# Patient Record
Sex: Female | Born: 1950
Health system: Southern US, Community
[De-identification: ages and names within clinical notes are randomized; demographics above are authoritative.]

## PROBLEM LIST (undated history)

## (undated) DIAGNOSIS — F32A Depression, unspecified: Secondary | ICD-10-CM

## (undated) DIAGNOSIS — F329 Major depressive disorder, single episode, unspecified: Secondary | ICD-10-CM

## (undated) HISTORY — PX: DILATION AND CURETTAGE OF UTERUS: SHX78

---

## 2011-05-11 ENCOUNTER — Other Ambulatory Visit (HOSPITAL_COMMUNITY)
Admission: RE | Admit: 2011-05-11 | Discharge: 2011-05-11 | Disposition: A | Discharge status: Home / Self Care | Payer: BC Managed Care – PPO | Source: Ambulatory Visit | Attending: Family Medicine | Admitting: Family Medicine

## 2011-05-11 DIAGNOSIS — Z1159 Encounter for screening for other viral diseases: Secondary | ICD-10-CM | POA: Insufficient documentation

## 2011-05-11 DIAGNOSIS — Z124 Encounter for screening for malignant neoplasm of cervix: Secondary | ICD-10-CM | POA: Insufficient documentation

## 2011-05-12 ENCOUNTER — Other Ambulatory Visit: Payer: Self-pay | Admitting: Family Medicine

## 2011-05-12 DIAGNOSIS — Z1231 Encounter for screening mammogram for malignant neoplasm of breast: Secondary | ICD-10-CM

## 2011-05-27 ENCOUNTER — Ambulatory Visit
Admission: RE | Admit: 2011-05-27 | Discharge: 2011-05-27 | Disposition: A | Discharge status: Home / Self Care | Payer: BC Managed Care – PPO | Source: Ambulatory Visit | Attending: Family Medicine | Admitting: Family Medicine

## 2011-05-27 DIAGNOSIS — Z1231 Encounter for screening mammogram for malignant neoplasm of breast: Secondary | ICD-10-CM

## 2012-01-12 ENCOUNTER — Encounter (HOSPITAL_COMMUNITY): Payer: Self-pay | Admitting: *Deleted

## 2012-01-12 ENCOUNTER — Ambulatory Visit (HOSPITAL_COMMUNITY)
Admission: RE | Admit: 2012-01-12 | Discharge: 2012-01-12 | Disposition: A | Discharge status: Home / Self Care | Payer: BC Managed Care – PPO | Source: Ambulatory Visit | Attending: Gastroenterology | Admitting: Gastroenterology

## 2012-01-12 NOTE — Anesthesia Preprocedure Evaluation (Addendum)
Anesthesia Evaluation  Patient identified by MRN, date of birth, ID band Patient awake    Reviewed: Allergy & Precautions, H&P , NPO status , Patient's Chart, lab work & pertinent test results  Airway Mallampati: I TM Distance: >3 FB Neck ROM: Full    Dental No notable dental hx. (+) Teeth Intact and Dental Advisory Given   Pulmonary neg pulmonary ROS,  breath sounds clear to auscultation  Pulmonary exam normal       Cardiovascular Exercise Tolerance: Good negative cardio ROS  Rhythm:Regular Rate:Normal     Neuro/Psych PSYCHIATRIC DISORDERS Depression negative neurological ROS  negative psych ROS   GI/Hepatic negative GI ROS, Neg liver ROS,   Endo/Other  negative endocrine ROS  Renal/GU negative Renal ROS  negative genitourinary   Musculoskeletal negative musculoskeletal ROS (+)   Abdominal   Peds negative pediatric ROS (+)  Hematology negative hematology ROS (+)   Anesthesia Other Findings   Reproductive/Obstetrics negative OB ROS                          Anesthesia Physical Anesthesia Plan  ASA: I  Anesthesia Plan: MAC   Post-op Pain Management:    Induction: Intravenous  Airway Management Planned:   Additional Equipment:   Intra-op Plan:   Post-operative Plan:   Informed Consent: I have reviewed the patients History and Physical, chart, labs and discussed the procedure including the risks, benefits and alternatives for the proposed anesthesia with the patient or authorized representative who has indicated his/her understanding and acceptance.   Dental advisory given  Plan Discussed with: CRNA  Anesthesia Plan Comments:         Anesthesia Quick Evaluation

## 2012-01-13 ENCOUNTER — Encounter (HOSPITAL_COMMUNITY)
Admission: RE | Disposition: A | Discharge status: Home / Self Care | Payer: Self-pay | Source: Ambulatory Visit | Attending: Gastroenterology

## 2012-01-13 ENCOUNTER — Ambulatory Visit (HOSPITAL_COMMUNITY): Payer: BC Managed Care – PPO | Admitting: Anesthesiology

## 2012-01-13 ENCOUNTER — Ambulatory Visit (HOSPITAL_COMMUNITY)
Admission: RE | Admit: 2012-01-13 | Discharge: 2012-01-13 | Disposition: A | Discharge status: Home / Self Care | Payer: BC Managed Care – PPO | Source: Ambulatory Visit | Attending: Gastroenterology | Admitting: Gastroenterology

## 2012-01-13 ENCOUNTER — Encounter (HOSPITAL_COMMUNITY): Payer: Self-pay | Admitting: Anesthesiology

## 2012-01-13 ENCOUNTER — Encounter (HOSPITAL_COMMUNITY): Payer: Self-pay | Admitting: *Deleted

## 2012-01-13 DIAGNOSIS — D126 Benign neoplasm of colon, unspecified: Secondary | ICD-10-CM | POA: Insufficient documentation

## 2012-01-13 DIAGNOSIS — K5730 Diverticulosis of large intestine without perforation or abscess without bleeding: Secondary | ICD-10-CM | POA: Insufficient documentation

## 2012-01-13 HISTORY — DX: Major depressive disorder, single episode, unspecified: F32.9

## 2012-01-13 HISTORY — DX: Depression, unspecified: F32.A

## 2012-01-13 HISTORY — PX: HOT HEMOSTASIS: SHX5433

## 2012-01-13 SURGERY — COLONOSCOPY WITH PROPOFOL
Anesthesia: Monitor Anesthesia Care

## 2012-01-13 MED ORDER — DROPERIDOL 2.5 MG/ML IJ SOLN
INTRAMUSCULAR | Status: DC | PRN
  Administered 2012-01-13: .5 mg via INTRAVENOUS

## 2012-01-13 MED ORDER — KETAMINE HCL 10 MG/ML IJ SOLN
INTRAMUSCULAR | Status: DC | PRN
  Administered 2012-01-13: 5 mg via INTRAVENOUS

## 2012-01-13 MED ORDER — PROPOFOL 10 MG/ML IV EMUL
INTRAVENOUS | Status: DC | PRN
  Administered 2012-01-13: 75 ug/kg/min via INTRAVENOUS

## 2012-01-13 MED ORDER — FENTANYL CITRATE 0.05 MG/ML IJ SOLN
INTRAMUSCULAR | Status: DC | PRN
  Administered 2012-01-13: 50 ug via INTRAVENOUS

## 2012-01-13 MED ORDER — LACTATED RINGERS IV SOLN: INTRAVENOUS | Status: DC

## 2012-01-13 MED ORDER — LACTATED RINGERS IV SOLN
INTRAVENOUS | Status: DC | PRN
  Administered 2012-01-13: 08:00:00 via INTRAVENOUS

## 2012-01-13 MED ORDER — MIDAZOLAM HCL 5 MG/5ML IJ SOLN
INTRAMUSCULAR | Status: DC | PRN
  Administered 2012-01-13 (×2): 1 mg via INTRAVENOUS

## 2012-01-13 MED ORDER — LACTATED RINGERS IV SOLN
INTRAVENOUS | Status: DC
  Administered 2012-01-13: 1000 mL via INTRAVENOUS

## 2012-01-13 MED ORDER — ONDANSETRON HCL 4 MG/2ML IJ SOLN
INTRAMUSCULAR | Status: DC | PRN
  Administered 2012-01-13: 4 mg via INTRAVENOUS

## 2012-01-13 MED ORDER — FENTANYL CITRATE 0.05 MG/ML IJ SOLN: 25.0000 ug | INTRAMUSCULAR | Status: DC | PRN

## 2012-01-13 MED ORDER — ASPIRIN 81 MG PO TABS: 81.0000 mg | ORAL_TABLET | Freq: Every day | ORAL | Status: DC

## 2012-01-13 SURGICAL SUPPLY — 21 items

## 2012-01-13 NOTE — Addendum Note (Signed)
Addendum  created 01/13/12 1104 by Ashauna Bertholf S Shayona Hibbitts, CRNA   Modules edited:Anesthesia Medication Administration    

## 2012-01-13 NOTE — Anesthesia Postprocedure Evaluation (Signed)
  Anesthesia Post-op Note  Patient: Brittany Villegas  Procedure(s) Performed: Procedure(s) (LRB): COLONOSCOPY WITH PROPOFOL (N/A) HOT HEMOSTASIS (ARGON PLASMA COAGULATION/BICAP) (N/A)  Patient Location: PACU  Anesthesia Type: MAC  Level of Consciousness: awake and alert   Airway and Oxygen Therapy: Patient Spontanous Breathing  Post-op Pain: mild  Post-op Assessment: Post-op Vital signs reviewed, Patient's Cardiovascular Status Stable, Respiratory Function Stable, Patent Airway and No signs of Nausea or vomiting  Post-op Vital Signs: stable  Complications: No apparent anesthesia complications

## 2012-01-13 NOTE — Discharge Instructions (Signed)
Colonoscopy ° °Post procedure instructions: ° °Read the instructions outlined below and refer to this sheet in the next few weeks. These discharge instructions provide you with general information on caring for yourself after you leave the hospital. Your doctor may also give you specific instructions. While your treatment has been planned according to the most current medical practices available, unavoidable complications occasionally occur. If you have any problems or questions after discharge, call Dr. Shardee Dieu at [facility] ([phone]). ° °HOME CARE INSTRUCTIONS ° °ACTIVITY: °· You may resume your regular activity, but move at a slower pace for the next 24 hours.  °· Take frequent rest periods for the next 24 hours.  °· Walking will help get rid of the air and reduce the bloated feeling in your belly (abdomen).  °· No driving for 24 hours (because of the medicine (anesthesia) used during the test).  °· You may shower.  °· Do not sign any important legal documents or operate any machinery for 24 hours (because of the anesthesia used during the test).  °NUTRITION: °· Drink plenty of fluids.  °· You may resume your normal diet as instructed by your doctor.  °· Begin with a light meal and progress to your normal diet. Heavy or fried foods are harder to digest and may make you feel sick to your stomach (nauseated).  °· Avoid alcoholic beverages for 24 hours or as instructed.  °MEDICATIONS: °· You may resume your normal medications unless your doctor tells you otherwise.  °WHAT TO EXPECT TODAY: °· Some feelings of bloating in the abdomen.  °· Passage of more gas than usual.  °· Spotting of blood in your stool or on the toilet paper.  °IF YOU HAD POLYPS REMOVED DURING THE COLONOSCOPY: °· No aspirin products for 7 days or as instructed.  °· No alcohol for 7 days or as instructed.  °· Eat a soft diet for the next 24 hours.  ° °FINDING OUT THE RESULTS OF YOUR TEST ° °Not all test results are available during your  visit. If your test results are not back during the visit, make an appointment with your caregiver to find out the results. Do not assume everything is normal if you have not heard from your caregiver or the medical facility. It is important for you to follow up on all of your test results.  ° ° ° °SEEK IMMEDIATE MEDICAL CARE IF: ° °· You have more than a spotting of blood in your stool.  °· Your belly is swollen (abdominal distention).  °· You are nauseated or vomiting.  °· You have a fever.  °· You have abdominal pain or discomfort that is severe or gets worse throughout the day.  ° ° °Document Released: 04/21/2004 Document Revised: 05/20/2011 Document Reviewed: 04/19/2008 °ExitCare® Patient Information ©2012 [employer]. ° °

## 2012-01-13 NOTE — Addendum Note (Signed)
Addendum  created 01/13/12 1104 by Valeda Malm, CRNA   Modules edited:Anesthesia Medication Administration

## 2012-01-13 NOTE — Transfer of Care (Signed)
Immediate Anesthesia Transfer of Care Note  Patient: Brittany Villegas  Procedure(s) Performed: Procedure(s) (LRB): COLONOSCOPY WITH PROPOFOL (N/A) HOT HEMOSTASIS (ARGON PLASMA COAGULATION/BICAP) (N/A)  Patient Location: PACU  Anesthesia Type: MAC  Level of Consciousness: awake  Airway & Oxygen Therapy: Patient Spontanous Breathing and Patient connected to face mask  Post-op Assessment: Report given to PACU RN  Post vital signs: Reviewed and stable  Complications: No apparent anesthesia complications

## 2012-01-13 NOTE — Op Note (Signed)
[facility] [address] [place], [place]  [address]  COLONOSCOPY PROCEDURE REPORT  PATIENT:  Brittany Villegas, Brittany Villegas  MR#:  [id number] BIRTHDATE:  03/09/1951, 60 yrs. old  GENDER:  female ENDOSCOPIST:  Willis Modena, MD REF. BY:  Ancil Boozer, M.D.  PROCEDURE DATE:  01/13/2012 PROCEDURE:  Colonoscopy with polypectomy and submucosal injection ASA CLASS:  Class I INDICATIONS:  large tubulovillous adenoma of proximal colon MEDICATIONS:   MAC sedation, administered by CRNA  DESCRIPTION OF PROCEDURE:   After the risks benefits and alternatives of the procedure were thoroughly explained, informed consent was obtained.  Digital rectal exam was performed and revealed no abnormalities.   The EC-[id number] ([id number]) endoscope was introduced through the anus and advanced to the cecum, which was identified by both the appendix and ileocecal valve, without limitations.  The quality of the prep was good..  The instrument was then slowly withdrawn as the colon was fully examined.  <<PROCEDUREIMAGES>>  FINDINGS:  Digital rectal exam normal.  Prep quality was good. Few left-sided diverticula.  At fold directly opposite to, but not involving, the ileocecal valve, a sessile 2cm polyp was seen with prior tattoo marking noted.  The polyp  was lifted via submucosal injection of saline (via the ERBE needleless device); endoscopic effect was good.  The polyp was then removed via snare cautery with good effect.  No other polyps, masses, vascular ectasias, or inflammatory changes were seen.  Retroflexed view of rectum was normal.  COMPLICATIONS:  None  ENDOSCOPIC IMPRESSION:    1.  Large proximal colon tubulovillous adenoma, removed as above. 2.  Sigmoid diverticulosis. 3.  Otherwise normal colonoscopy.  RECOMMENDATIONS:      1.  Watch for potential complications of procedure. 2.  No ASA/NSAIDs x 7 days post-poypectomy. 3.  Await polypectomy results. 4.  Will likely need repeat colonoscopy in  2-3 years, pending polypectomy results.  ______________________________ Willis Modena  CC:  n. eSIGNEDWillis Modena at 01/13/2012 09:22 AM  Kevin Fenton, [phone]

## 2012-01-13 NOTE — H&P (Signed)
Cc:  Large proximal colon polyp  HPI:  61 yo female who had screening colonoscopy October 2012, which showed 20 mm proximal colon polyp at junction of cecum and ileocecal valve.  Biopsies showed tubulovillous adenoma.  Site tattooed.  She is asymptomatic.  MEDS:  MVI, ASA, Zoloft  PMH/PSHx:  Anxiety, HTN, D&C 2007/2008  FHx:  No colon cancer or polyps  SocHx:  Paralegal  ROS:  As her HPI; all others negative  PE: GEN:  NAD ABD:  Soft NEURO:  Non-focal  Labs:  None  Assessment:  Large proximal colon polyp (tubulovillous adenoma)  Plan:   1.  Colonoscopy with polypectomy, possibly requiring submucosal lift of polyp, possible APC. 2.  Patient is aware of relative increased risk of post-polypectomy perforation (5%) and post-polypectomy bleeding (10%).  I have discussed the alternative of surgical removal, and she would prefer attempted endoscopic approach with surgery only if there are endoscopic complications or if endoscopic removal is not complete/definitive. 3.  Risks (bleeding, infection, bowel perforation that could require surgery, sedation-related changes in cardiopulmonary systems), benefits (identification and possible treatment of source of symptoms, exclusion of certain causes of symptoms), and alternatives (watchful waiting, radiographic imaging studies, empiric medical treatment) of colonoscopy were explained to patient in detail and she wishes to proceed.

## 2012-01-14 ENCOUNTER — Encounter (HOSPITAL_COMMUNITY): Payer: Self-pay | Admitting: Gastroenterology

## 2012-05-18 ENCOUNTER — Other Ambulatory Visit: Payer: Self-pay | Admitting: Family Medicine

## 2012-05-18 DIAGNOSIS — Z1231 Encounter for screening mammogram for malignant neoplasm of breast: Secondary | ICD-10-CM

## 2012-06-07 ENCOUNTER — Ambulatory Visit: Payer: Self-pay

## 2012-06-09 ENCOUNTER — Ambulatory Visit
Admission: RE | Admit: 2012-06-09 | Discharge: 2012-06-09 | Disposition: A | Discharge status: Home / Self Care | Payer: BC Managed Care – PPO | Source: Ambulatory Visit | Attending: Family Medicine | Admitting: Family Medicine

## 2012-06-09 DIAGNOSIS — Z1231 Encounter for screening mammogram for malignant neoplasm of breast: Secondary | ICD-10-CM

## 2012-08-30 ENCOUNTER — Other Ambulatory Visit: Payer: Self-pay

## 2013-08-24 ENCOUNTER — Other Ambulatory Visit: Payer: Self-pay

## 2013-08-24 DIAGNOSIS — Z1231 Encounter for screening mammogram for malignant neoplasm of breast: Secondary | ICD-10-CM

## 2013-09-27 ENCOUNTER — Ambulatory Visit
Admission: RE | Admit: 2013-09-27 | Discharge: 2013-09-27 | Disposition: A | Discharge status: Home / Self Care | Payer: BC Managed Care – PPO | Source: Ambulatory Visit

## 2013-09-27 DIAGNOSIS — Z1231 Encounter for screening mammogram for malignant neoplasm of breast: Secondary | ICD-10-CM

## 2014-11-02 ENCOUNTER — Other Ambulatory Visit: Payer: Self-pay

## 2014-11-02 DIAGNOSIS — Z1231 Encounter for screening mammogram for malignant neoplasm of breast: Secondary | ICD-10-CM

## 2014-11-09 ENCOUNTER — Encounter (INDEPENDENT_AMBULATORY_CARE_PROVIDER_SITE_OTHER): Payer: Self-pay

## 2014-11-09 ENCOUNTER — Ambulatory Visit
Admission: RE | Admit: 2014-11-09 | Discharge: 2014-11-09 | Disposition: A | Discharge status: Home / Self Care | Payer: BLUE CROSS/BLUE SHIELD | Source: Ambulatory Visit

## 2014-11-09 DIAGNOSIS — Z1231 Encounter for screening mammogram for malignant neoplasm of breast: Secondary | ICD-10-CM

## 2016-01-02 ENCOUNTER — Other Ambulatory Visit: Payer: Self-pay

## 2016-01-02 DIAGNOSIS — Z1231 Encounter for screening mammogram for malignant neoplasm of breast: Secondary | ICD-10-CM

## 2016-01-16 ENCOUNTER — Ambulatory Visit
Admission: RE | Admit: 2016-01-16 | Discharge: 2016-01-16 | Disposition: A | Discharge status: Home / Self Care | Payer: BLUE CROSS/BLUE SHIELD | Source: Ambulatory Visit

## 2016-01-16 DIAGNOSIS — Z1231 Encounter for screening mammogram for malignant neoplasm of breast: Secondary | ICD-10-CM

## 2017-04-08 DIAGNOSIS — H35371 Puckering of macula, right eye: Secondary | ICD-10-CM | POA: Diagnosis not present

## 2017-04-08 DIAGNOSIS — H35373 Puckering of macula, bilateral: Secondary | ICD-10-CM | POA: Diagnosis not present

## 2017-04-16 DIAGNOSIS — H35373 Puckering of macula, bilateral: Secondary | ICD-10-CM | POA: Diagnosis not present

## 2017-04-16 DIAGNOSIS — H35343 Macular cyst, hole, or pseudohole, bilateral: Secondary | ICD-10-CM | POA: Diagnosis not present

## 2017-05-07 DIAGNOSIS — H35371 Puckering of macula, right eye: Secondary | ICD-10-CM | POA: Diagnosis not present

## 2017-05-07 DIAGNOSIS — H35341 Macular cyst, hole, or pseudohole, right eye: Secondary | ICD-10-CM | POA: Diagnosis not present

## 2017-07-28 DIAGNOSIS — H35372 Puckering of macula, left eye: Secondary | ICD-10-CM | POA: Diagnosis not present

## 2017-07-28 DIAGNOSIS — H25013 Cortical age-related cataract, bilateral: Secondary | ICD-10-CM | POA: Diagnosis not present

## 2017-07-28 DIAGNOSIS — H43812 Vitreous degeneration, left eye: Secondary | ICD-10-CM | POA: Diagnosis not present

## 2017-07-28 DIAGNOSIS — H2513 Age-related nuclear cataract, bilateral: Secondary | ICD-10-CM | POA: Diagnosis not present

## 2017-10-12 DIAGNOSIS — H2513 Age-related nuclear cataract, bilateral: Secondary | ICD-10-CM | POA: Diagnosis not present

## 2017-10-12 DIAGNOSIS — H02839 Dermatochalasis of unspecified eye, unspecified eyelid: Secondary | ICD-10-CM | POA: Diagnosis not present

## 2017-10-12 DIAGNOSIS — H25013 Cortical age-related cataract, bilateral: Secondary | ICD-10-CM | POA: Diagnosis not present

## 2017-10-12 DIAGNOSIS — H2511 Age-related nuclear cataract, right eye: Secondary | ICD-10-CM | POA: Diagnosis not present

## 2017-10-12 DIAGNOSIS — H35372 Puckering of macula, left eye: Secondary | ICD-10-CM | POA: Diagnosis not present

## 2017-10-12 DIAGNOSIS — H25043 Posterior subcapsular polar age-related cataract, bilateral: Secondary | ICD-10-CM | POA: Diagnosis not present

## 2017-11-01 DIAGNOSIS — H25011 Cortical age-related cataract, right eye: Secondary | ICD-10-CM | POA: Diagnosis not present

## 2017-11-01 DIAGNOSIS — H2511 Age-related nuclear cataract, right eye: Secondary | ICD-10-CM | POA: Diagnosis not present

## 2017-11-02 DIAGNOSIS — H2512 Age-related nuclear cataract, left eye: Secondary | ICD-10-CM | POA: Diagnosis not present

## 2017-11-15 DIAGNOSIS — H25012 Cortical age-related cataract, left eye: Secondary | ICD-10-CM | POA: Diagnosis not present

## 2017-11-15 DIAGNOSIS — H2512 Age-related nuclear cataract, left eye: Secondary | ICD-10-CM | POA: Diagnosis not present

## 2018-01-17 ENCOUNTER — Other Ambulatory Visit: Payer: Self-pay | Admitting: Family Medicine

## 2018-01-17 DIAGNOSIS — Z1231 Encounter for screening mammogram for malignant neoplasm of breast: Secondary | ICD-10-CM

## 2018-01-19 DIAGNOSIS — E785 Hyperlipidemia, unspecified: Secondary | ICD-10-CM | POA: Diagnosis not present

## 2018-01-19 DIAGNOSIS — Z23 Encounter for immunization: Secondary | ICD-10-CM | POA: Diagnosis not present

## 2018-01-19 DIAGNOSIS — R7303 Prediabetes: Secondary | ICD-10-CM | POA: Diagnosis not present

## 2018-01-19 DIAGNOSIS — Z0000 Encounter for general adult medical examination without abnormal findings: Secondary | ICD-10-CM | POA: Diagnosis not present

## 2018-02-17 ENCOUNTER — Ambulatory Visit
Admission: RE | Admit: 2018-02-17 | Discharge: 2018-02-17 | Disposition: A | Discharge status: Home / Self Care | Payer: PPO | Source: Ambulatory Visit | Attending: Family Medicine | Admitting: Family Medicine

## 2018-02-17 DIAGNOSIS — Z1231 Encounter for screening mammogram for malignant neoplasm of breast: Secondary | ICD-10-CM | POA: Diagnosis not present

## 2018-02-18 ENCOUNTER — Other Ambulatory Visit: Payer: Self-pay | Admitting: Family Medicine

## 2018-02-18 DIAGNOSIS — R928 Other abnormal and inconclusive findings on diagnostic imaging of breast: Secondary | ICD-10-CM

## 2018-02-22 ENCOUNTER — Ambulatory Visit: Payer: PPO

## 2018-02-22 ENCOUNTER — Ambulatory Visit
Admission: RE | Admit: 2018-02-22 | Discharge: 2018-02-22 | Disposition: A | Discharge status: Home / Self Care | Payer: PPO | Source: Ambulatory Visit | Attending: Family Medicine | Admitting: Family Medicine

## 2018-02-22 DIAGNOSIS — R928 Other abnormal and inconclusive findings on diagnostic imaging of breast: Secondary | ICD-10-CM | POA: Diagnosis not present

## 2018-03-15 DIAGNOSIS — Z961 Presence of intraocular lens: Secondary | ICD-10-CM | POA: Diagnosis not present

## 2018-03-15 DIAGNOSIS — H35373 Puckering of macula, bilateral: Secondary | ICD-10-CM | POA: Diagnosis not present

## 2018-03-15 DIAGNOSIS — H26491 Other secondary cataract, right eye: Secondary | ICD-10-CM | POA: Diagnosis not present

## 2018-03-15 DIAGNOSIS — H18413 Arcus senilis, bilateral: Secondary | ICD-10-CM | POA: Diagnosis not present

## 2018-03-23 DIAGNOSIS — D225 Melanocytic nevi of trunk: Secondary | ICD-10-CM | POA: Diagnosis not present

## 2018-03-23 DIAGNOSIS — D1800 Hemangioma unspecified site: Secondary | ICD-10-CM | POA: Diagnosis not present

## 2018-03-23 DIAGNOSIS — L814 Other melanin hyperpigmentation: Secondary | ICD-10-CM | POA: Diagnosis not present

## 2018-03-23 DIAGNOSIS — D2271 Melanocytic nevi of right lower limb, including hip: Secondary | ICD-10-CM | POA: Diagnosis not present

## 2018-03-23 DIAGNOSIS — Z808 Family history of malignant neoplasm of other organs or systems: Secondary | ICD-10-CM | POA: Diagnosis not present

## 2018-03-23 DIAGNOSIS — D692 Other nonthrombocytopenic purpura: Secondary | ICD-10-CM | POA: Diagnosis not present

## 2018-03-23 DIAGNOSIS — L821 Other seborrheic keratosis: Secondary | ICD-10-CM | POA: Diagnosis not present

## 2018-10-05 DIAGNOSIS — H04123 Dry eye syndrome of bilateral lacrimal glands: Secondary | ICD-10-CM | POA: Diagnosis not present

## 2018-10-05 DIAGNOSIS — H43812 Vitreous degeneration, left eye: Secondary | ICD-10-CM | POA: Diagnosis not present

## 2018-10-05 DIAGNOSIS — H35372 Puckering of macula, left eye: Secondary | ICD-10-CM | POA: Diagnosis not present

## 2018-10-05 DIAGNOSIS — Z961 Presence of intraocular lens: Secondary | ICD-10-CM | POA: Diagnosis not present

## 2019-03-30 DIAGNOSIS — H35373 Puckering of macula, bilateral: Secondary | ICD-10-CM | POA: Diagnosis not present

## 2019-04-19 DIAGNOSIS — H40011 Open angle with borderline findings, low risk, right eye: Secondary | ICD-10-CM | POA: Diagnosis not present

## 2019-10-18 DIAGNOSIS — H40011 Open angle with borderline findings, low risk, right eye: Secondary | ICD-10-CM | POA: Diagnosis not present

## 2019-10-23 DIAGNOSIS — H35373 Puckering of macula, bilateral: Secondary | ICD-10-CM | POA: Diagnosis not present

## 2019-10-23 DIAGNOSIS — Z961 Presence of intraocular lens: Secondary | ICD-10-CM | POA: Diagnosis not present

## 2019-10-23 DIAGNOSIS — H04123 Dry eye syndrome of bilateral lacrimal glands: Secondary | ICD-10-CM | POA: Diagnosis not present

## 2019-12-07 DIAGNOSIS — R7303 Prediabetes: Secondary | ICD-10-CM | POA: Diagnosis not present

## 2019-12-07 DIAGNOSIS — R000 Tachycardia, unspecified: Secondary | ICD-10-CM | POA: Diagnosis not present

## 2019-12-07 DIAGNOSIS — I10 Essential (primary) hypertension: Secondary | ICD-10-CM | POA: Diagnosis not present

## 2019-12-18 ENCOUNTER — Telehealth: Payer: Self-pay

## 2019-12-18 NOTE — Telephone Encounter (Signed)
NOTES ON FILE FROM [facility] [id number] SENT REFERRAL TO SCHEDULING

## 2019-12-25 ENCOUNTER — Telehealth: Payer: Self-pay

## 2019-12-25 NOTE — Telephone Encounter (Signed)
Left message for pt to call the office to update family history.

## 2019-12-28 NOTE — Progress Notes (Signed)
Cardiology Office Note:    Date:  12/29/2019   ID:  Brittany Villegas, DOB 01-Oct-1950, MRN [id number]  PCP:  Patria Mane, MD (Inactive)  Cardiologist:  No primary care provider on file.  Electrophysiologist:  None   Referring MD: London Pepper, MD   Chief Complaint  Patient presents with  . Tachycardia    History of Present Illness:    Brittany Villegas is a 69 y.o. female with a hx of prediabetes, hyperlipidemia, morbid obesity depression who is referred by Dr. Orland Mustard for evaluation of rapid heart rate.  Was seen by Dr. Orland Mustard on 3/18 and started on metoprolol.  She reports episodes of tachycardia for several years. Reports has been occurring since 2012. Has had 2 episodes this year. During episodes feels like heart is racing. Lasts for hours. Check pulse during last episode in was 173. Does not feel irregular. Denies any lightheadedness, syncope, or chest pain. She does report she has been having some dyspnea with exertion. Since starting metoprolol she feels very fatigued and has not been exercising. Her HR has been down to 40s.  No smoking history. Mother had mitral valve replacement at age 50, atrial fibrillation, pacemaker. Father had atrial fibrillation in 80s, pacemaker. She reports she drinks 1 cup of coffee per day. No alcohol intake.  Labs from 3/18 showed normal renal function, electrolytes, TSH, blood counts.  A1c 6.0  Past Medical History:  Diagnosis Date  . Depression     Past Surgical History:  Procedure Laterality Date  . DILATION AND CURETTAGE OF UTERUS    . HOT HEMOSTASIS  01/13/2012   Procedure: HOT HEMOSTASIS (ARGON PLASMA COAGULATION/BICAP);  Surgeon: Arta Silence, MD;  Location: [facility];  Service: Endoscopy;  Laterality: N/A;    Current Medications: Current Meds  Medication Sig  . [DISCONTINUED] aspirin 81 MG tablet Take 1 tablet (81 mg total) by mouth daily.  . [DISCONTINUED] metoprolol succinate (TOPROL-XL) 50 MG 24 hr tablet Take 50 mg by mouth  daily. Take with or immediately following a meal.  . [DISCONTINUED] Multiple Vitamin (MULTIVITAMIN) tablet Take 1 tablet by mouth daily.  . [DISCONTINUED] sertraline (ZOLOFT) 50 MG tablet Take 50 mg by mouth daily.     Allergies:   Patient has no known allergies.   Social History   Socioeconomic History  . Marital status: Single    Spouse name: Not on file  . Number of children: Not on file  . Years of education: Not on file  . Highest education level: Not on file  Occupational History  . Not on file  Tobacco Use  . Smoking status: Never Smoker  Substance and Sexual Activity  . Alcohol use: Yes  . Drug use: No  . Sexual activity: Not on file  Other Topics Concern  . Not on file  Social History Narrative  . Not on file   Social Determinants of Health   Financial Resource Strain:   . Difficulty of Paying Living Expenses:   Food Insecurity:   . Worried About Charity fundraiser in the Last Year:   . Arboriculturist in the Last Year:   Transportation Needs:   . Film/video editor (Medical):   Marland Kitchen Lack of Transportation (Non-Medical):   Physical Activity:   . Days of Exercise per Week:   . Minutes of Exercise per Session:   Stress:   . Feeling of Stress :   Social Connections:   . Frequency of Communication with Friends and Family:   .  Frequency of Social Gatherings with Friends and Family:   . Attends Religious Services:   . Active Member of Clubs or Organizations:   . Attends Archivist Meetings:   Marland Kitchen Marital Status:      Family History: The patient's family history is not on file.  ROS:   Please see the history of present illness.     All other systems reviewed and are negative.  EKGs/Labs/Other Studies Reviewed:    The following studies were reviewed today:   EKG:  EKG is  ordered today.  The ekg ordered today demonstrates sinus bradycardia, rate 55, low voltage, left axis deviation  Recent Labs: No results found for requested labs within  last 8760 hours.  Recent Lipid Panel No results found for: CHOL, TRIG, HDL, CHOLHDL, VLDL, LDLCALC, LDLDIRECT  Physical Exam:    VS:  BP 125/74   Pulse (!) 53   Temp (!) 97 F (36.1 C)   Ht 5\' 3"  (1.6 m)   Wt 290 lb 6.4 oz (131.7 kg)   SpO2 98%   BMI 51.44 kg/m     Wt Readings from Last 3 Encounters:  12/29/19 290 lb 6.4 oz (131.7 kg)  01/13/12 182 lb (82.6 kg)     GEN:  in no acute distress HEENT: Normal NECK: No JVD; No carotid bruits CARDIAC: RRR, no murmurs, rubs, gallops RESPIRATORY:  Clear to auscultation without rales, wheezing or rhonchi  ABDOMEN: Soft, non-tender, non-distended MUSCULOSKELETAL:  No edema; No deformity  SKIN: Warm and dry NEUROLOGIC:  Alert and oriented x 3 PSYCHIATRIC:  Normal affect   ASSESSMENT:    1. Palpitations   2. Essential hypertension    PLAN:    Palpitations: Description concerning for arrhythmia, will evaluate with 30-day cardiac monitor. Recommend holding metoprolol while wearing monitor. Will check TTE to evaluate for structural heart disease  Hypertension: BP appears controlled, but since holding metoprolol, will instead start on amlodipine 5 mg daily. Asked patient to check BP daily for next 2 weeks and call with results  RTC in 3 months   Medication Adjustments/Labs and Tests Ordered: Current medicines are reviewed at length with the patient today.  Concerns regarding medicines are outlined above.  Orders Placed This Encounter  Procedures  . Cardiac event monitor  . EKG 12-Lead  . ECHOCARDIOGRAM COMPLETE   Meds ordered this encounter  Medications  . DISCONTD: amLODipine (NORVASC) 5 MG tablet    Sig: Take 1 tablet (5 mg total) by mouth daily.    Dispense:  180 tablet    Refill:  3  . amLODipine (NORVASC) 5 MG tablet    Sig: Take 1 tablet (5 mg total) by mouth daily.    Dispense:  90 tablet    Refill:  3    Patient Instructions  Medication Instructions:  Your physician has recommended you make the following  change in your medication:   1. Stop Metoprolol 2. Begin Amlodipine 5mg , one tablet daily  Labwork: None ordered.  Testing/Procedures: Your physician has recommended that you wear an event monitor. Event monitors are medical devices that record the heart's electrical activity. Doctors most often [place] these monitors to diagnose arrhythmias. Arrhythmias are problems with the speed or rhythm of the heartbeat. The monitor is a small, portable device. You can wear one while you do your normal daily activities. This is usually used to diagnose what is causing palpitations/syncope (passing out).  Your physician has requested that you have an echocardiogram. Echocardiography is a painless test that  uses sound waves to create images of your heart. It provides your doctor with information about the size and shape of your heart and how well your heart's chambers and valves are working. This procedure takes approximately one hour. There are no restrictions for this procedure.   Follow-Up: Your physician recommends that you schedule a follow-up appointment in:   3 months with Dr. Gardiner Rhyme  Any Other Special Instructions Will Be Listed Below (If Applicable).     If you need a refill on your cardiac medications before your next appointment, please call your pharmacy.     Signed, Donato Heinz, MD  12/29/2019 10:06 AM    [place]

## 2019-12-29 ENCOUNTER — Ambulatory Visit: Payer: PPO | Admitting: Cardiology

## 2019-12-29 ENCOUNTER — Encounter: Payer: Self-pay | Admitting: *Deleted

## 2019-12-29 ENCOUNTER — Other Ambulatory Visit: Payer: Self-pay

## 2019-12-29 VITALS — BP 125/74 | HR 53 | Temp 97.0°F | Ht 63.0 in | Wt 290.4 lb

## 2019-12-29 DIAGNOSIS — R002 Palpitations: Secondary | ICD-10-CM

## 2019-12-29 DIAGNOSIS — I10 Essential (primary) hypertension: Secondary | ICD-10-CM | POA: Diagnosis not present

## 2019-12-29 MED ORDER — AMLODIPINE BESYLATE 5 MG PO TABS: 5.0000 mg | ORAL_TABLET | Freq: Every day | ORAL | 3 refills | Status: DC

## 2019-12-29 NOTE — Progress Notes (Signed)
Patient ID: Brittany Villegas, female   DOB: 09/27/1950, 69 y.o.   MRN: [id number] Patient enrolled for [employer] to ship a 30 day cardiac event monitor to her home.  Instructions sent to patient via My Chart Message, and will also be included in her monitor kit.

## 2019-12-29 NOTE — Patient Instructions (Signed)
Medication Instructions:  Your physician has recommended you make the following change in your medication:   1. Stop Metoprolol 2. Begin Amlodipine 5mg , one tablet daily  Labwork: None ordered.  Testing/Procedures: Your physician has recommended that you wear an event monitor. Event monitors are medical devices that record the heart's electrical activity. Doctors most often [place] these monitors to diagnose arrhythmias. Arrhythmias are problems with the speed or rhythm of the heartbeat. The monitor is a small, portable device. You can wear one while you do your normal daily activities. This is usually used to diagnose what is causing palpitations/syncope (passing out).  Your physician has requested that you have an echocardiogram. Echocardiography is a painless test that uses sound waves to create images of your heart. It provides your doctor with information about the size and shape of your heart and how well your heart's chambers and valves are working. This procedure takes approximately one hour. There are no restrictions for this procedure.   Follow-Up: Your physician recommends that you schedule a follow-up appointment in:   3 months with Dr. Gardiner Rhyme  Any Other Special Instructions Will Be Listed Below (If Applicable).     If you need a refill on your cardiac medications before your next appointment, please call your pharmacy.

## 2020-01-05 ENCOUNTER — Ambulatory Visit (INDEPENDENT_AMBULATORY_CARE_PROVIDER_SITE_OTHER): Payer: PPO

## 2020-01-05 DIAGNOSIS — R002 Palpitations: Secondary | ICD-10-CM | POA: Diagnosis not present

## 2020-01-19 ENCOUNTER — Ambulatory Visit (HOSPITAL_COMMUNITY): Payer: PPO | Attending: Internal Medicine

## 2020-01-19 ENCOUNTER — Other Ambulatory Visit: Payer: Self-pay

## 2020-01-19 DIAGNOSIS — R002 Palpitations: Secondary | ICD-10-CM

## 2020-01-19 MED ORDER — PERFLUTREN LIPID MICROSPHERE
1.0000 mL | INTRAVENOUS | Status: AC | PRN
  Administered 2020-01-19: 2 mL via INTRAVENOUS

## 2020-01-22 ENCOUNTER — Other Ambulatory Visit: Payer: Self-pay | Admitting: *Deleted

## 2020-01-22 DIAGNOSIS — I712 Thoracic aortic aneurysm, without rupture, unspecified: Secondary | ICD-10-CM

## 2020-01-23 ENCOUNTER — Other Ambulatory Visit: Payer: Self-pay | Admitting: *Deleted

## 2020-01-23 ENCOUNTER — Telehealth: Payer: Self-pay | Admitting: *Deleted

## 2020-01-23 DIAGNOSIS — Z01812 Encounter for preprocedural laboratory examination: Secondary | ICD-10-CM

## 2020-01-23 DIAGNOSIS — I719 Aortic aneurysm of unspecified site, without rupture: Secondary | ICD-10-CM

## 2020-01-23 NOTE — Telephone Encounter (Signed)
Spoke with patient regarding appointment for CTA chest/aorta scheduled Monday 02/12/20 at 10:30 am at [facility]--[address]---Liquids only 4 hours prior to study----Patient to come in this week for lab work.

## 2020-02-01 ENCOUNTER — Other Ambulatory Visit: Payer: Self-pay

## 2020-02-01 DIAGNOSIS — Z0000 Encounter for general adult medical examination without abnormal findings: Secondary | ICD-10-CM | POA: Diagnosis not present

## 2020-02-01 DIAGNOSIS — E2839 Other primary ovarian failure: Secondary | ICD-10-CM | POA: Diagnosis not present

## 2020-02-01 DIAGNOSIS — I10 Essential (primary) hypertension: Secondary | ICD-10-CM | POA: Diagnosis not present

## 2020-02-01 DIAGNOSIS — Z01812 Encounter for preprocedural laboratory examination: Secondary | ICD-10-CM

## 2020-02-01 DIAGNOSIS — E785 Hyperlipidemia, unspecified: Secondary | ICD-10-CM | POA: Diagnosis not present

## 2020-02-01 DIAGNOSIS — R7303 Prediabetes: Secondary | ICD-10-CM | POA: Diagnosis not present

## 2020-02-01 DIAGNOSIS — Z23 Encounter for immunization: Secondary | ICD-10-CM | POA: Diagnosis not present

## 2020-02-01 DIAGNOSIS — I739 Peripheral vascular disease, unspecified: Secondary | ICD-10-CM | POA: Diagnosis not present

## 2020-02-01 DIAGNOSIS — F419 Anxiety disorder, unspecified: Secondary | ICD-10-CM | POA: Diagnosis not present

## 2020-02-01 DIAGNOSIS — I719 Aortic aneurysm of unspecified site, without rupture: Secondary | ICD-10-CM

## 2020-02-02 LAB — BASIC METABOLIC PANEL
BUN/Creatinine Ratio: 16 (ref 12–28)
BUN: 14 mg/dL (ref 8–27)
CO2: 28 mmol/L (ref 20–29)
Calcium: 9.8 mg/dL (ref 8.7–10.3)
Chloride: 100 mmol/L (ref 96–106)
Creatinine, Ser: 0.9 mg/dL (ref 0.57–1.00)
GFR calc Af Amer: 75 mL/min/{1.73_m2} (ref >59)
GFR calc non Af Amer: 65 mL/min/{1.73_m2} (ref >59)
Glucose: 139 mg/dL — ABNORMAL HIGH (ref 65–99)
Potassium: 4.9 mmol/L (ref 3.5–5.2)
Sodium: 143 mmol/L (ref 134–144)

## 2020-02-06 ENCOUNTER — Other Ambulatory Visit: Payer: Self-pay | Admitting: Family Medicine

## 2020-02-06 DIAGNOSIS — I779 Disorder of arteries and arterioles, unspecified: Secondary | ICD-10-CM

## 2020-02-08 ENCOUNTER — Other Ambulatory Visit: Payer: Self-pay | Admitting: Family Medicine

## 2020-02-08 DIAGNOSIS — E2839 Other primary ovarian failure: Secondary | ICD-10-CM

## 2020-02-12 ENCOUNTER — Ambulatory Visit (INDEPENDENT_AMBULATORY_CARE_PROVIDER_SITE_OTHER)
Admission: RE | Admit: 2020-02-12 | Discharge: 2020-02-12 | Disposition: A | Discharge status: Home / Self Care | Payer: PPO | Source: Ambulatory Visit | Attending: Cardiology | Admitting: Cardiology

## 2020-02-12 ENCOUNTER — Other Ambulatory Visit: Payer: Self-pay

## 2020-02-12 DIAGNOSIS — I712 Thoracic aortic aneurysm, without rupture, unspecified: Secondary | ICD-10-CM

## 2020-02-12 MED ORDER — IOHEXOL 350 MG/ML SOLN
100.0000 mL | Freq: Once | INTRAVENOUS | Status: AC | PRN
  Administered 2020-02-12: 100 mL via INTRAVENOUS

## 2020-02-13 ENCOUNTER — Other Ambulatory Visit: Payer: Self-pay | Admitting: *Deleted

## 2020-02-13 DIAGNOSIS — I712 Thoracic aortic aneurysm, without rupture, unspecified: Secondary | ICD-10-CM

## 2020-02-14 ENCOUNTER — Telehealth: Payer: Self-pay | Admitting: Cardiology

## 2020-02-14 NOTE — Telephone Encounter (Signed)
I spoke with patient regarding monitor results.  She is aware of appointment on May 28,2021 and is planning on being at appointment

## 2020-02-14 NOTE — Telephone Encounter (Signed)
Patient returning call for results. She is scheduled Friday 02/16/2020.

## 2020-02-16 ENCOUNTER — Other Ambulatory Visit: Payer: Self-pay

## 2020-02-16 ENCOUNTER — Ambulatory Visit: Payer: PPO | Admitting: Cardiology

## 2020-02-16 VITALS — BP 146/92 | HR 65 | Ht 63.0 in | Wt 272.0 lb

## 2020-02-16 DIAGNOSIS — I10 Essential (primary) hypertension: Secondary | ICD-10-CM

## 2020-02-16 DIAGNOSIS — I471 Supraventricular tachycardia, unspecified: Secondary | ICD-10-CM

## 2020-02-16 DIAGNOSIS — I480 Paroxysmal atrial fibrillation: Secondary | ICD-10-CM | POA: Diagnosis not present

## 2020-02-16 DIAGNOSIS — R0683 Snoring: Secondary | ICD-10-CM

## 2020-02-16 DIAGNOSIS — E785 Hyperlipidemia, unspecified: Secondary | ICD-10-CM

## 2020-02-16 DIAGNOSIS — I77819 Aortic ectasia, unspecified site: Secondary | ICD-10-CM | POA: Diagnosis not present

## 2020-02-16 MED ORDER — METOPROLOL TARTRATE 25 MG PO TABS: 25.0000 mg | ORAL_TABLET | Freq: Two times a day (BID) | ORAL | 3 refills | Status: DC

## 2020-02-16 MED ORDER — APIXABAN 5 MG PO TABS: 5.0000 mg | ORAL_TABLET | Freq: Two times a day (BID) | ORAL | 3 refills | Status: DC

## 2020-02-16 NOTE — Patient Instructions (Signed)
Medication Instructions:  START metoprolol tartrate (Lopressor) 25 mg two times daily START Eliquis 5 mg two times daily  *If you need a refill on your cardiac medications before your next appointment, please call your pharmacy*  Testing/Procedures: Your physician has recommended that you have a sleep study. This test records several body functions during sleep, including: brain activity, eye movement, oxygen and carbon dioxide blood levels, heart rate and rhythm, breathing rate and rhythm, the flow of air through your mouth and nose, snoring, body muscle movements, and chest and belly movement.  Follow-Up: At [facility], you and your health needs are our priority.  As part of our continuing mission to provide you with exceptional heart care, we have created designated Provider Care Teams.  These Care Teams include your primary Cardiologist (physician) and Advanced Practice Providers (APPs -  Physician Assistants and Nurse Practitioners) who all work together to provide you with the care you need, when you need it.  We recommend signing up for the patient portal called "MyChart".  Sign up information is provided on this After Visit Summary.  MyChart is used to connect with patients for Virtual Visits (Telemedicine).  Patients are able to view lab/test results, encounter notes, upcoming appointments, etc.  Non-urgent messages can be sent to your provider as well.   To learn more about what you can do with MyChart, go to [email or web address].    Your next appointment:   3 month(s)  The format for your next appointment:   In Person  Provider:   Oswaldo Milian, MD

## 2020-02-16 NOTE — Progress Notes (Signed)
Cardiology Office Note:    Date:  02/17/2020   ID:  Brittany Villegas, DOB 21-Sep-1951, MRN [id number]  PCP:  Reatha Armour, MD  Cardiologist:  No primary care provider on file.  Electrophysiologist:  None   Referring MD: No ref. provider found   Chief Complaint  Patient presents with  . Palpitations    History of Present Illness:    Brittany Villegas is a 69 y.o. female with a hx of prediabetes, hyperlipidemia, morbid obesity depression who presents for follow-up.  She was referred by Dr. Orland Mustard for evaluation of rapid heart rate, initially seen on 12/29/2019.  Was seen by Dr. Orland Mustard on 3/18 and started on metoprolol.  She reports episodes of tachycardia for several years. Reports has been occurring since 2012. Has had 2 episodes this year. During episodes feels like heart is racing. Lasts for hours. Check pulse during last episode in was 173. Does not feel irregular. Denies any lightheadedness, syncope, or chest pain. She does report she has been having some dyspnea with exertion. Since starting metoprolol she feels very fatigued and has not been exercising. Her HR has been down to 40s.  No smoking history. Mother had mitral valve replacement at age 66, atrial fibrillation, pacemaker. Father had atrial fibrillation in 80s, pacemaker. She reports she drinks 1 cup of coffee per day. No alcohol intake.Labs from 3/18 showed normal renal function, electrolytes, TSH, blood counts.  A1c 6.0.  TTE on 01/19/2020 showed LVEF 60 to 65%, normal RV function, mild mitral regurgitation, mild ascending aortic dilatation measuring 39 mm.  CTA chest on 02/12/2020 showed mildly ectatic ascending thoracic aorta measuring 39 mm.  Cardiac monitor on 02/07/2020 showed multiple short episodes of SVT, as well as a 3-hour episode of what was recorded as atrial fibrillation, but appears more likely atrial flutter or SVT.  Since last clinic visit, she reports that she is felt more palpitations this week.  Has occurred 3-4 times last 5  days.  She denies any chest pain, dyspnea, lightheadedness, or syncope.  She reports that she snores, has never been tested for OSA.   Past Medical History:  Diagnosis Date  . Depression     Past Surgical History:  Procedure Laterality Date  . DILATION AND CURETTAGE OF UTERUS    . HOT HEMOSTASIS  01/13/2012   Procedure: HOT HEMOSTASIS (ARGON PLASMA COAGULATION/BICAP);  Surgeon: Arta Silence, MD;  Location: [facility];  Service: Endoscopy;  Laterality: N/A;    Current Medications: Current Meds  Medication Sig  . amLODipine (NORVASC) 5 MG tablet Take 1 tablet (5 mg total) by mouth daily.  . sertraline (ZOLOFT) 50 MG tablet Take 50 mg by mouth daily.     Allergies:   Patient has no known allergies.   Social History   Socioeconomic History  . Marital status: Single    Spouse name: Not on file  . Number of children: Not on file  . Years of education: Not on file  . Highest education level: Not on file  Occupational History  . Not on file  Tobacco Use  . Smoking status: Never Smoker  Substance and Sexual Activity  . Alcohol use: Yes  . Drug use: No  . Sexual activity: Not on file  Other Topics Concern  . Not on file  Social History Narrative  . Not on file   Social Determinants of Health   Financial Resource Strain:   . Difficulty of Paying Living Expenses:   Food Insecurity:   . Worried About Running  Out of Food in the Last Year:   . [place] in the Last Year:   Transportation Needs:   . Lack of Transportation (Medical):   Marland Kitchen Lack of Transportation (Non-Medical):   Physical Activity:   . Days of Exercise per Week:   . Minutes of Exercise per Session:   Stress:   . Feeling of Stress :   Social Connections:   . Frequency of Communication with Friends and Family:   . Frequency of Social Gatherings with Friends and Family:   . Attends Religious Services:   . Active Member of Clubs or Organizations:   . Attends Archivist Meetings:   Marland Kitchen  Marital Status:      Family History: The patient's family history is not on file.  ROS:   Please see the history of present illness.     All other systems reviewed and are negative.  EKGs/Labs/Other Studies Reviewed:    The following studies were reviewed today:   EKG:  EKG is  ordered today.  The ekg ordered today demonstrates sinus rhythm, rate 65, left axis deviation, nonspecific T wave flattening, poor R wave progression   Recent Labs: 02/01/2020: BUN 14; Creatinine, Ser 0.90; Potassium 4.9; Sodium 143  Recent Lipid Panel No results found for: CHOL, TRIG, HDL, CHOLHDL, VLDL, LDLCALC, LDLDIRECT  Physical Exam:    VS:  BP (!) 146/92   Pulse 65   Ht 5\' 3"  (1.6 m)   Wt 272 lb (123.4 kg)   SpO2 99%   BMI 48.18 kg/m     Wt Readings from Last 3 Encounters:  02/16/20 272 lb (123.4 kg)  12/29/19 290 lb 6.4 oz (131.7 kg)  01/13/12 182 lb (82.6 kg)     GEN:  in no acute distress HEENT: Normal NECK: No JVD; No carotid bruits CARDIAC: RRR, no murmurs, rubs, gallops RESPIRATORY:  Clear to auscultation without rales, wheezing or rhonchi  ABDOMEN: Soft, non-tender, non-distended MUSCULOSKELETAL:  No edema; No deformity  SKIN: Warm and dry NEUROLOGIC:  Alert and oriented x 3 PSYCHIATRIC:  Normal affect   ASSESSMENT:    1. Paroxysmal atrial fibrillation (HCC)   2. Snoring   3. Essential hypertension   4. SVT (supraventricular tachycardia) ([place])   5. Ectatic aorta (HCC)   6. Hyperlipidemia, unspecified hyperlipidemia type    PLAN:    Atrial fibrillation/atrial flutter/SVT: Cardiac monitor on 02/07/2020 showed multiple short episodes of SVT, as well as a 3-hour episode of what was recorded as atrial fibrillation, but appears more likely atrial flutter or SVT.  Rates up to 180. -Start metoprolol 25 mg twice daily -Start Eliquis 5 mg BID -Sleep study  Hypertension: On amlodipine 5 mg daily.  BP elevated in clinic today, starting metoprolol for AF as above  Snoring:  Will check sleep study as above  Dilated thoracic aorta: Ascending aorta measures 39 mm on CTA, will follow up with [facility] in 1 year for monitoring  Hyperlipidemia: LDL 117 on 02/01/2020.  10-year ASCVD risk score 14%.  Recently started on pravastatin by PCP.  If tolerating will plan to switch to high intensity statin  RTC in 3 months   Medication Adjustments/Labs and Tests Ordered: Current medicines are reviewed at length with the patient today.  Concerns regarding medicines are outlined above.  Orders Placed This Encounter  Procedures  . EKG 12-Lead  . Split night study   Meds ordered this encounter  Medications  . metoprolol tartrate (LOPRESSOR) 25 MG tablet  Sig: Take 1 tablet (25 mg total) by mouth 2 (two) times daily.    Dispense:  180 tablet    Refill:  3  . apixaban (ELIQUIS) 5 MG TABS tablet    Sig: Take 1 tablet (5 mg total) by mouth 2 (two) times daily.    Dispense:  60 tablet    Refill:  3    Patient Instructions  Medication Instructions:  START metoprolol tartrate (Lopressor) 25 mg two times daily START Eliquis 5 mg two times daily  *If you need a refill on your cardiac medications before your next appointment, please call your pharmacy*  Testing/Procedures: Your physician has recommended that you have a sleep study. This test records several body functions during sleep, including: brain activity, eye movement, oxygen and carbon dioxide blood levels, heart rate and rhythm, breathing rate and rhythm, the flow of air through your mouth and nose, snoring, body muscle movements, and chest and belly movement.  Follow-Up: At [facility], you and your health needs are our priority.  As part of our continuing mission to provide you with exceptional heart care, we have created designated Provider Care Teams.  These Care Teams include your primary Cardiologist (physician) and Advanced Practice Providers (APPs -  Physician Assistants and Nurse Practitioners) who all work  together to provide you with the care you need, when you need it.  We recommend signing up for the patient portal called "MyChart".  Sign up information is provided on this After Visit Summary.  MyChart is used to connect with patients for Virtual Visits (Telemedicine).  Patients are able to view lab/test results, encounter notes, upcoming appointments, etc.  Non-urgent messages can be sent to your provider as well.   To learn more about what you can do with MyChart, go to [email or web address].    Your next appointment:   3 month(s)  The format for your next appointment:   In Person  Provider:   Oswaldo Milian, MD       Signed, Donato Heinz, MD  02/17/2020 12:59 AM    [place]

## 2020-02-20 ENCOUNTER — Ambulatory Visit
Admission: RE | Admit: 2020-02-20 | Discharge: 2020-02-20 | Disposition: A | Discharge status: Home / Self Care | Payer: PPO | Source: Ambulatory Visit | Attending: Family Medicine | Admitting: Family Medicine

## 2020-02-20 DIAGNOSIS — E041 Nontoxic single thyroid nodule: Secondary | ICD-10-CM | POA: Diagnosis not present

## 2020-02-20 DIAGNOSIS — I779 Disorder of arteries and arterioles, unspecified: Secondary | ICD-10-CM

## 2020-02-20 DIAGNOSIS — I6521 Occlusion and stenosis of right carotid artery: Secondary | ICD-10-CM | POA: Diagnosis not present

## 2020-02-26 ENCOUNTER — Other Ambulatory Visit: Payer: Self-pay | Admitting: Family Medicine

## 2020-02-26 DIAGNOSIS — E041 Nontoxic single thyroid nodule: Secondary | ICD-10-CM

## 2020-03-06 DIAGNOSIS — Z23 Encounter for immunization: Secondary | ICD-10-CM | POA: Diagnosis not present

## 2020-03-06 DIAGNOSIS — E041 Nontoxic single thyroid nodule: Secondary | ICD-10-CM | POA: Diagnosis not present

## 2020-03-06 DIAGNOSIS — F419 Anxiety disorder, unspecified: Secondary | ICD-10-CM | POA: Diagnosis not present

## 2020-03-06 DIAGNOSIS — E785 Hyperlipidemia, unspecified: Secondary | ICD-10-CM | POA: Diagnosis not present

## 2020-03-06 DIAGNOSIS — I10 Essential (primary) hypertension: Secondary | ICD-10-CM | POA: Diagnosis not present

## 2020-03-07 ENCOUNTER — Telehealth: Payer: Self-pay | Admitting: Cardiology

## 2020-03-07 MED ORDER — METOPROLOL TARTRATE 25 MG PO TABS
12.5000 mg | ORAL_TABLET | Freq: Two times a day (BID) | ORAL | 1 refills | Status: DC
Start: 2020-03-07 — End: 2020-04-09

## 2020-03-07 NOTE — Telephone Encounter (Signed)
Called patient, advised of message from PharmD.  Updated med list.  Patient verbalized understanding.

## 2020-03-07 NOTE — Telephone Encounter (Signed)
Pt c/o medication issue:  1. Name of Medication: metoprolol tartrate (LOPRESSOR) 25 MG tablet  2. How are you currently taking this medication (dosage and times per day)? As directed   3. Are you having a reaction (difficulty breathing--STAT)? no  4. What is your medication issue? Low pulse and fatigue  STAT if HR is under 50 or over 120 (normal HR is 60-100 beats per minute)  1) What is your heart rate? 45  2) Do you have a log of your heart rate readings (document readings)? [id number]  3) Do you have any other symptoms? Fatigued.   PT wonders if she needs to adjust the dosage of her medication

## 2020-03-07 NOTE — Telephone Encounter (Signed)
Patient called in- stating that since starting the Metoprolol medication about 3 weeks ago- she has had her HR into the low 40's. Her BP has been well controlled- only able to give me top numbers of 120-118. She states that she has no other symptoms (CP, SOB, swelling) just felling very tired, and low energy. She advised that this has happened before and her PCP took her off the Metoprolol- she takes it 25 mg twice daily, and the last time she took it was the morning.   I advised with patient I would notify MD and PharmD for advice on medications.

## 2020-03-07 NOTE — Telephone Encounter (Signed)
Decrease metoprolol to 12.5mg  twice daily (and update medlist).   Continue to monitor BP and HR to discuss during f/u visit

## 2020-03-08 ENCOUNTER — Telehealth: Payer: Self-pay | Admitting: *Deleted

## 2020-03-08 NOTE — Telephone Encounter (Signed)
-----   Message from Silverio Lay, RN sent at 02/16/2020 10:55 AM EDT ----- Regarding: sleep study Sleep study ordered per Dr. Gardiner Rhyme.  [place] in chart  Thanks!

## 2020-03-12 ENCOUNTER — Ambulatory Visit
Admission: RE | Admit: 2020-03-12 | Discharge: 2020-03-12 | Disposition: A | Discharge status: Home / Self Care | Payer: PPO | Source: Ambulatory Visit | Attending: Family Medicine | Admitting: Family Medicine

## 2020-03-12 DIAGNOSIS — E041 Nontoxic single thyroid nodule: Secondary | ICD-10-CM

## 2020-03-22 ENCOUNTER — Ambulatory Visit
Admission: RE | Admit: 2020-03-22 | Discharge: 2020-03-22 | Disposition: A | Discharge status: Home / Self Care | Payer: PPO | Source: Ambulatory Visit | Attending: Family Medicine | Admitting: Family Medicine

## 2020-03-22 ENCOUNTER — Other Ambulatory Visit: Payer: Self-pay

## 2020-03-22 DIAGNOSIS — Z780 Asymptomatic menopausal state: Secondary | ICD-10-CM | POA: Diagnosis not present

## 2020-03-22 DIAGNOSIS — E2839 Other primary ovarian failure: Secondary | ICD-10-CM

## 2020-03-26 DIAGNOSIS — L719 Rosacea, unspecified: Secondary | ICD-10-CM | POA: Diagnosis not present

## 2020-03-26 DIAGNOSIS — D2271 Melanocytic nevi of right lower limb, including hip: Secondary | ICD-10-CM | POA: Diagnosis not present

## 2020-03-26 DIAGNOSIS — D225 Melanocytic nevi of trunk: Secondary | ICD-10-CM | POA: Diagnosis not present

## 2020-03-26 DIAGNOSIS — L814 Other melanin hyperpigmentation: Secondary | ICD-10-CM | POA: Diagnosis not present

## 2020-03-26 DIAGNOSIS — L821 Other seborrheic keratosis: Secondary | ICD-10-CM | POA: Diagnosis not present

## 2020-03-26 DIAGNOSIS — Z808 Family history of malignant neoplasm of other organs or systems: Secondary | ICD-10-CM | POA: Diagnosis not present

## 2020-03-26 DIAGNOSIS — L578 Other skin changes due to chronic exposure to nonionizing radiation: Secondary | ICD-10-CM | POA: Diagnosis not present

## 2020-03-27 NOTE — Progress Notes (Signed)
Virtual Visit via Telephone Note   This visit type was conducted due to national recommendations for restrictions regarding the COVID-19 Pandemic (e.g. social distancing) in an effort to limit this patient's exposure and mitigate transmission in our community.  Due to her co-morbid illnesses, this patient is at least at moderate risk for complications without adequate follow up.  This format is felt to be most appropriate for this patient at this time.  The patient did not have access to video technology/had technical difficulties with video requiring transitioning to audio format only (telephone).  All issues noted in this document were discussed and addressed.  No physical exam could be performed with this format.  Please refer to the patient's chart for her  consent to telehealth for [facility].   Date:  03/28/2020   ID:  Brittany Villegas, DOB [birth year], MRN [id number]  Patient Location: Home Provider Location: Home  PCP:  Reatha Armour, MD  Cardiologist:  Dr.Schumann  Electrophysiologist:  None   Evaluation Performed:  Follow-Up Visit  Chief Complaint:  Follow Up  History of Present Illness:    Brittany Villegas is a 69 y.o. female we are following for ongoing assessment and management of rapid heart rate.  She was seen by Dr. Gardiner Rhyme on 02/08/2020 after complaints of rapid heart rhythm which has been normal for several years since 2012, which she describes as lasting for hours.   Denies any lightheadedness, syncope, or chest pain. She does report she has been having some dyspnea with exertion.  No smoking history. Mother had mitral valve replacement at age 74, atrial fibrillation, pacemaker. Father had atrial fibrillation in 80s, pacemaker. She reports she drinks 1 cup of coffee per day. No alcohol intake.Labs from 3/18 showed normal renal function, electrolytes, TSH, blood counts.  A1c 6.0.  TTE on 01/19/2020 showed LVEF 60 to 65%, normal RV function, mild mitral regurgitation, mild  ascending aortic dilatation measuring 39 mm.  CTA chest on 02/12/2020 showed mildly ectatic ascending thoracic aorta measuring 39 mm.    Cardiac monitor on 02/07/2020 showed multiple short episodes of SVT, as well as a 3-hour episode of what was recorded as atrial fibrillation, but appears more likely atrial flutter or SVT.  Review of cardiac monitor by Dr. Nechama Guard did reveal atrial fibrillation, atrial flutter, SVT dated 02/07/2020.  When seen last by Dr. Gardiner Rhyme, on 02/16/2020, she was started on metoprolol 25 mg daily, Eliquis 5 mg twice daily and was scheduled for sleep study.  She is here for follow-up to evaluate her response to medications.  She continues to have HR in the 40's. She states that she is very tired and has to rest with minimal exertion. She has no energy.  She was also noted to have a dilated thoracic aorta, ascending aorta measuring 39 mm on CTA, and will need a follow-up MRI in 02/07/2021.   The patient does not have symptoms concerning for COVID-19 infection (fever, chills, cough, or new shortness of breath).    Past Medical History:  Diagnosis Date  . Depression    Past Surgical History:  Procedure Laterality Date  . DILATION AND CURETTAGE OF UTERUS    . HOT HEMOSTASIS  01/13/2012   Procedure: HOT HEMOSTASIS (ARGON PLASMA COAGULATION/BICAP);  Surgeon: Arta Silence, MD;  Location: [facility];  Service: Endoscopy;  Laterality: N/A;     Current Meds  Medication Sig  . amLODipine (NORVASC) 5 MG tablet Take 1 tablet (5 mg total) by mouth daily.  Marland Kitchen apixaban (ELIQUIS) 5 MG  TABS tablet Take 1 tablet (5 mg total) by mouth 2 (two) times daily.  . metoprolol tartrate (LOPRESSOR) 25 MG tablet Take 0.5 tablets (12.5 mg total) by mouth 2 (two) times daily.  . metroNIDAZOLE (METROCREAM) 0.75 % cream Apply 1 application topically 2 (two) times daily.  . pravastatin (PRAVACHOL) 10 MG tablet Take 10 mg by mouth daily.  . sertraline (ZOLOFT) 100 MG tablet Take 100 mg by mouth  daily.  . [DISCONTINUED] sertraline (ZOLOFT) 50 MG tablet Take 50 mg by mouth daily.     Allergies:   Patient has no known allergies.   Social History   Tobacco Use  . Smoking status: Never Smoker  . Smokeless tobacco: Never Used  Vaping Use  . Vaping Use: Never used  Substance Use Topics  . Alcohol use: Yes  . Drug use: No     Family Hx: The patient's family history is not on file.  ROS:   Please see the history of present illness.    All other systems reviewed and are negative.   Prior CV studies:   The following studies were reviewed today: Cardiac Monitor 02/07/2020    There was a 3 hour episode of what was recorded as atrial fibrillation. On review, appears regular narrow complex tachycardia with rate 180. Suspect SVT or atrial flutter.  Multiple episodes of SVT, rates 150-160s  NSVT up to 4 beats   Predominant rhythm is sinus rhythm. Range is 42 to 204 bpm with average of 63 bpm. No sustained ventricular tachycardia, significant pause, or high degree AV block. There was a 3 hour episode of what was recorded as atrial fibrillation.  On review, appears regular narrow complex tachycardia with rate 180.  Suspect SVT or atrial flutter.  AF burden <1%.  AF rates 147 to 204 bpm with average 161 bpm. Multiple episodes of SVT.  NSVT up to 4 beats.  Total ventricular ectopy <1%. 0 patient triggered events.    Carotid Ultrasound 02/20/2020 IMPRESSION: 1. No significant atherosclerotic plaque or evidence of stenosis in either internal carotid artery. 2. Mildly tortuous right common carotid artery likely accounts for the pulsatile abnormality. 3. Both vertebral arteries are patent with antegrade flow. 4. Incidentally noted 2.1 cm isoechoic solid nodule in the right thyroid gland. Recommend further evaluation with dedicated thyroid Ultrasound.  Echocardiogram 01/19/2020 1. Left ventricular ejection fraction, by estimation, is 60 to 65%. The  left ventricle has normal  function. The left ventricle has no regional  wall motion abnormalities. Left ventricular diastolic parameters were  normal.  2. Right ventricular systolic function is normal. The right ventricular  size is normal.  3. The mitral valve is abnormal. Mild mitral valve regurgitation.  4. The aortic valve is abnormal. Aortic valve regurgitation is not  visualized. Mild aortic valve sclerosis is present, with no evidence of  aortic valve stenosis.  5. Aortic dilatation noted. There is mild dilatation of the ascending  aorta measuring 39 mm.  Labs/Other Tests and Data Reviewed:    EKG:  No ECG reviewed.  Recent Labs: 02/01/2020: BUN 14; Creatinine, Ser 0.90; Potassium 4.9; Sodium 143   Recent Lipid Panel No results found for: CHOL, TRIG, HDL, CHOLHDL, LDLCALC, LDLDIRECT  Wt Readings from Last 3 Encounters:  03/28/20 260 lb (117.9 kg)  02/16/20 272 lb (123.4 kg)  12/29/19 290 lb 6.4 oz (131.7 kg)     Objective:    Vital Signs:  BP 121/67   Pulse (!) 45   Ht 5\' 3"  (1.6 m)  Wt 260 lb (117.9 kg)   BMI 46.06 kg/m  Limited due to phone visit.   VITAL SIGNS:  reviewed GEN:  no acute distress RESPIRATORY:  normal respiratory effort, symmetric expansion NEURO:  alert and oriented x 3, no obvious focal deficit PSYCH:  normal affect  ASSESSMENT & PLAN:    1. PAF: Noted on cardiac monitor.  She is on Eliquis 5 mg BID. She is not tolerating metoprolol. States that her HR is staying in the 40's, Will stop metoprolol temporarily (5 days) have her check her BP and HR daily and record. If she has break through rapid HR we may need to change her to diltiazem, stop the amlodipine and metoprolol.  I will discuss with Dr. Gardiner Rhyme.   2. Hypertension: She is well controlled on metoprolol and amlodipine. Not tolerating metoprolol. Consider changing to diltiazem for HR control and BP control on follow up.   3.Hyperlipidemia:  Follow up lipids and LFT's on next visit in August unless completed  by PCP.   COVID-19 Education: The signs and symptoms of COVID-19 were discussed with the patient and how to seek care for testing (follow up with PCP or arrange E-visit).  The importance of social distancing was discussed today.  Time:   Today, I have spent 25 minutes with the patient with telehealth technology discussing the above problems, reviewing and documenting.    Medication Adjustments/Labs and Tests Ordered: Current medicines are reviewed at length with the patient today.  Concerns regarding medicines are outlined above.   Tests Ordered: No orders of the defined types were placed in this encounter.   Medication Changes: No orders of the defined types were placed in this encounter.   Disposition:  Follow up previously scheduled appointment in August.   Signed, Phill Myron. West Pugh, ANP, AACC  03/28/2020 12:01 PM    [facility]

## 2020-03-28 ENCOUNTER — Telehealth (INDEPENDENT_AMBULATORY_CARE_PROVIDER_SITE_OTHER): Payer: PPO | Admitting: Adult Health

## 2020-03-28 ENCOUNTER — Ambulatory Visit: Payer: PPO | Admitting: Cardiology

## 2020-03-28 ENCOUNTER — Encounter: Payer: Self-pay | Admitting: Adult Health

## 2020-03-28 VITALS — BP 121/67 | HR 45 | Ht 63.0 in | Wt 260.0 lb

## 2020-03-28 DIAGNOSIS — I10 Essential (primary) hypertension: Secondary | ICD-10-CM | POA: Diagnosis not present

## 2020-03-28 DIAGNOSIS — I719 Aortic aneurysm of unspecified site, without rupture: Secondary | ICD-10-CM | POA: Diagnosis not present

## 2020-03-28 DIAGNOSIS — I480 Paroxysmal atrial fibrillation: Secondary | ICD-10-CM | POA: Diagnosis not present

## 2020-03-28 DIAGNOSIS — E7800 Pure hypercholesterolemia, unspecified: Secondary | ICD-10-CM | POA: Diagnosis not present

## 2020-03-28 NOTE — Patient Instructions (Signed)
Medication Instructions:  STOP- Metoprolol until Monday July 12th and give office a call  *If you need a refill on your cardiac medications before your next appointment, please call your pharmacy*   Lab Work: None Ordered   Testing/Procedures: None Ordered   Follow-Up: At [facility], you and your health needs are our priority.  As part of our continuing mission to provide you with exceptional heart care, we have created designated Provider Care Teams.  These Care Teams include your primary Cardiologist (physician) and Advanced Practice Providers (APPs -  Physician Assistants and Nurse Practitioners) who all work together to provide you with the care you need, when you need it.  We recommend signing up for the patient portal called "MyChart".  Sign up information is provided on this After Visit Summary.  MyChart is used to connect with patients for Virtual Visits (Telemedicine).  Patients are able to view lab/test results, encounter notes, upcoming appointments, etc.  Non-urgent messages can be sent to your provider as well.   To learn more about what you can do with MyChart, go to [email or web address].    Your next appointment:   Keep appointment with Dr Gardiner Rhyme on August 30th at 9:00 am  The format for your next appointment:   In Person

## 2020-04-02 ENCOUNTER — Other Ambulatory Visit: Payer: Self-pay | Admitting: *Deleted

## 2020-04-02 DIAGNOSIS — I495 Sick sinus syndrome: Secondary | ICD-10-CM

## 2020-04-02 DIAGNOSIS — I480 Paroxysmal atrial fibrillation: Secondary | ICD-10-CM

## 2020-04-02 NOTE — Progress Notes (Signed)
Thanks Curt Bears.  I would favor referring her to EP for tachybrady syndrome, as she was going pretty fast when in AF/SVT but not able to tolerate low dose metoprolol due to low resting heart rates.

## 2020-04-09 ENCOUNTER — Ambulatory Visit: Payer: PPO | Admitting: Internal Medicine

## 2020-04-09 ENCOUNTER — Encounter: Payer: Self-pay | Admitting: *Deleted

## 2020-04-09 ENCOUNTER — Encounter: Payer: Self-pay | Admitting: Internal Medicine

## 2020-04-09 ENCOUNTER — Other Ambulatory Visit: Payer: Self-pay

## 2020-04-09 VITALS — BP 134/90 | HR 64 | Ht 63.0 in | Wt 261.0 lb

## 2020-04-09 DIAGNOSIS — I471 Supraventricular tachycardia: Secondary | ICD-10-CM

## 2020-04-09 DIAGNOSIS — R0683 Snoring: Secondary | ICD-10-CM

## 2020-04-09 DIAGNOSIS — E785 Hyperlipidemia, unspecified: Secondary | ICD-10-CM

## 2020-04-09 DIAGNOSIS — I77819 Aortic ectasia, unspecified site: Secondary | ICD-10-CM

## 2020-04-09 DIAGNOSIS — I10 Essential (primary) hypertension: Secondary | ICD-10-CM | POA: Diagnosis not present

## 2020-04-09 MED ORDER — AMIODARONE HCL 200 MG PO TABS
200.0000 mg | ORAL_TABLET | Freq: Two times a day (BID) | ORAL | 3 refills | Status: DC
Start: 2020-04-09 — End: 2020-04-09

## 2020-04-09 NOTE — Progress Notes (Signed)
HPI Brittany Villegas is referred today by Dr. Gardiner Rhyme for evaluation of SVT. She is a pleasant morbidly obese woman with tachy-palpitations for about 8 years. Initially these were infrequent and self contained but over the past couple of years the episodes have gotten more frequent. She has not had syncope but feels sob and palpitations. She wore a cardiac monitor demonstrating SVT at 180.  No Known Allergies   Current Outpatient Medications  Medication Sig Dispense Refill  . amLODipine (NORVASC) 5 MG tablet Take 1 tablet (5 mg total) by mouth daily. 90 tablet 3  . apixaban (ELIQUIS) 5 MG TABS tablet Take 1 tablet (5 mg total) by mouth 2 (two) times daily. 60 tablet 3  . metroNIDAZOLE (METROCREAM) 0.75 % cream Apply 1 application topically 2 (two) times daily.    . pravastatin (PRAVACHOL) 10 MG tablet Take 10 mg by mouth daily.    . sertraline (ZOLOFT) 100 MG tablet Take 100 mg by mouth daily.     No current facility-administered medications for this visit.     Past Medical History:  Diagnosis Date  . Depression     ROS:   All systems reviewed and negative except as noted in the HPI.   Past Surgical History:  Procedure Laterality Date  . DILATION AND CURETTAGE OF UTERUS    . HOT HEMOSTASIS  01/13/2012   Procedure: HOT HEMOSTASIS (ARGON PLASMA COAGULATION/BICAP);  Surgeon: Arta Silence, MD;  Location: [facility];  Service: Endoscopy;  Laterality: N/A;     No family history on file.   Social History   Socioeconomic History  . Marital status: Single    Spouse name: Not on file  . Number of children: Not on file  . Years of education: Not on file  . Highest education level: Not on file  Occupational History  . Not on file  Tobacco Use  . Smoking status: Never Smoker  . Smokeless tobacco: Never Used  Vaping Use  . Vaping Use: Never used  Substance and Sexual Activity  . Alcohol use: Yes  . Drug use: No  . Sexual activity: Not on file  Other Topics Concern    . Not on file  Social History Narrative  . Not on file   Social Determinants of Health   Financial Resource Strain:   . Difficulty of Paying Living Expenses:   Food Insecurity:   . Worried About Charity fundraiser in the Last Year:   . Arboriculturist in the Last Year:   Transportation Needs:   . Film/video editor (Medical):   Marland Kitchen Lack of Transportation (Non-Medical):   Physical Activity:   . Days of Exercise per Week:   . Minutes of Exercise per Session:   Stress:   . Feeling of Stress :   Social Connections:   . Frequency of Communication with Friends and Family:   . Frequency of Social Gatherings with Friends and Family:   . Attends Religious Services:   . Active Member of Clubs or Organizations:   . Attends Archivist Meetings:   Marland Kitchen Marital Status:   Intimate Partner Violence:   . Fear of Current or Ex-Partner:   . Emotionally Abused:   Marland Kitchen Physically Abused:   . Sexually Abused:      BP 134/90   Pulse 64   Ht 5\' 3"  (1.6 m)   Wt 261 lb (118.4 kg)   SpO2 97%   BMI 46.23 kg/m   Physical  Exam:  Well appearing NAD HEENT: Unremarkable Neck:  No JVD, no thyromegally Lymphatics:  No adenopathy Back:  No CVA tenderness Lungs:  Clear with no wheezes HEART:  Regular rate rhythm, no murmurs, no rubs, no clicks Abd:  soft, positive bowel sounds, no organomegally, no rebound, no guarding Ext:  2 plus pulses, no edema, no cyanosis, no clubbing Skin:  No rashes no nodules Neuro:  CN II through XII intact, motor grossly intact  EKG - nsr with AV pacing  DEVICE  Normal device function.  See PaceArt for details.   Assess/Plan: 1. SVT - I have discussed the treatment options with the patient in detail. She has a short RP tachycardia with PR prolongation with initiation of her SVT. I have reviewed the indications/risk/benefits/goals/expectations of SVT  Ablation and she will call us if she wishes to proceed. 2. Obesity - following ablation, I have asked  that she work on trying to lose weight. 3. PAF - I did not see any of this when I reviewed her monitor.   Mikle Bosworth.D.

## 2020-04-09 NOTE — Patient Instructions (Addendum)
Medication Instructions:  Your physician recommends that you continue on your current medications as directed. Please refer to the Current Medication list given to you today.  *If you need a refill on your cardiac medications before your next appointment, please call your pharmacy*  Lab Work: None ordered.  If you have labs (blood work) drawn today and your tests are completely normal, you will receive your results only by: Marland Kitchen MyChart Message (if you have MyChart) OR . A paper copy in the mail If you have any lab test that is abnormal or we need to change your treatment, we will call you to review the results.  Testing/Procedures: None ordered.  Follow-Up: At [facility], you and your health needs are our priority.  As part of our continuing mission to provide you with exceptional heart care, we have created designated Provider Care Teams.  These Care Teams include your primary Cardiologist (physician) and Advanced Practice Providers (APPs -  Physician Assistants and Nurse Practitioners) who all work together to provide you with the care you need, when you need it.  We recommend signing up for the patient portal called "MyChart".  Sign up information is provided on this After Visit Summary.  MyChart is used to connect with patients for Virtual Visits (Telemedicine).  Patients are able to view lab/test results, encounter notes, upcoming appointments, etc.  Non-urgent messages can be sent to your provider as well.   To learn more about what you can do with MyChart, go to [email or web address].     Other Instructions: Your physician has recommended that you have an ablation. Catheter ablation is a medical procedure used to treat some cardiac arrhythmias (irregular heartbeats). During catheter ablation, a long, thin, flexible tube is put into a blood vessel in your groin (upper thigh), or neck. This tube is called an ablation catheter. It is then guided to your heart through the blood vessel.  Radio frequency waves destroy small areas of heart tissue where abnormal heartbeats may cause an arrhythmia to start.   The dates are available at time: July: 26,29 Aug: 2,5,9,10,16,19,27,30  Please call the office and ask to speak to Sonia Baller when you are ready to schedule. [phone]

## 2020-04-09 NOTE — H&P (View-Only) (Signed)
HPI Brittany Villegas is referred today by Dr. Gardiner Rhyme for evaluation of SVT. She is a pleasant morbidly obese woman with tachy-palpitations for about 8 years. Initially these were infrequent and self contained but over the past couple of years the episodes have gotten more frequent. She has not had syncope but feels sob and palpitations. She wore a cardiac monitor demonstrating SVT at 180.  No Known Allergies   Current Outpatient Medications  Medication Sig Dispense Refill   amLODipine (NORVASC) 5 MG tablet Take 1 tablet (5 mg total) by mouth daily. 90 tablet 3   apixaban (ELIQUIS) 5 MG TABS tablet Take 1 tablet (5 mg total) by mouth 2 (two) times daily. 60 tablet 3   metroNIDAZOLE (METROCREAM) 0.75 % cream Apply 1 application topically 2 (two) times daily.     pravastatin (PRAVACHOL) 10 MG tablet Take 10 mg by mouth daily.     sertraline (ZOLOFT) 100 MG tablet Take 100 mg by mouth daily.     No current facility-administered medications for this visit.     Past Medical History:  Diagnosis Date   Depression     ROS:   All systems reviewed and negative except as noted in the HPI.   Past Surgical History:  Procedure Laterality Date   DILATION AND CURETTAGE OF UTERUS     HOT HEMOSTASIS  01/13/2012   Procedure: HOT HEMOSTASIS (ARGON PLASMA COAGULATION/BICAP);  Surgeon: Arta Silence, MD;  Location: [facility];  Service: Endoscopy;  Laterality: N/A;     No family history on file.   Social History   Socioeconomic History   Marital status: Single    Spouse name: Not on file   Number of children: Not on file   Years of education: Not on file   Highest education level: Not on file  Occupational History   Not on file  Tobacco Use   Smoking status: Never Smoker   Smokeless tobacco: Never Used  Vaping Use   Vaping Use: Never used  Substance and Sexual Activity   Alcohol use: Yes   Drug use: No   Sexual activity: Not on file  Other Topics Concern     Not on file  Social History Narrative   Not on file   Social Determinants of Health   Financial Resource Strain:    Difficulty of Paying Living Expenses:   Food Insecurity:    Worried About Charity fundraiser in the Last Year:    Arboriculturist in the Last Year:   Transportation Needs:    Film/video editor (Medical):    Lack of Transportation (Non-Medical):   Physical Activity:    Days of Exercise per Week:    Minutes of Exercise per Session:   Stress:    Feeling of Stress :   Social Connections:    Frequency of Communication with Friends and Family:    Frequency of Social Gatherings with Friends and Family:    Attends Religious Services:    Active Member of Clubs or Organizations:    Attends Archivist Meetings:    Marital Status:   Intimate Partner Violence:    Fear of Current or Ex-Partner:    Emotionally Abused:    Physically Abused:    Sexually Abused:      BP 134/90    Pulse 64    Ht 5\' 3"  (1.6 m)    Wt 261 lb (118.4 kg)    SpO2 97%    BMI  46.23 kg/m   Physical Exam:  Well appearing NAD HEENT: Unremarkable Neck:  No JVD, no thyromegally Lymphatics:  No adenopathy Back:  No CVA tenderness Lungs:  Clear with no wheezes HEART:  Regular rate rhythm, no murmurs, no rubs, no clicks Abd:  soft, positive bowel sounds, no organomegally, no rebound, no guarding Ext:  2 plus pulses, no edema, no cyanosis, no clubbing Skin:  No rashes no nodules Neuro:  CN II through XII intact, motor grossly intact  EKG - nsr with AV pacing  DEVICE  Normal device function.  See PaceArt for details.   Assess/Plan: 1. SVT - I have discussed the treatment options with the patient in detail. She has a short RP tachycardia with PR prolongation with initiation of her SVT. I have reviewed the indications/risk/benefits/goals/expectations of SVT  Ablation and she will call us if she wishes to proceed. 2. Obesity - following ablation, I have asked  that she work on trying to lose weight. 3. PAF - I did not see any of this when I reviewed her monitor.   Mikle Bosworth.D.

## 2020-04-10 ENCOUNTER — Telehealth: Payer: Self-pay | Admitting: Internal Medicine

## 2020-04-10 DIAGNOSIS — I471 Supraventricular tachycardia: Secondary | ICD-10-CM

## 2020-04-10 DIAGNOSIS — Z01810 Encounter for preprocedural cardiovascular examination: Secondary | ICD-10-CM

## 2020-04-10 NOTE — Telephone Encounter (Signed)
Noted! Thank you

## 2020-04-10 NOTE — Telephone Encounter (Addendum)
Pt called and agreed to SVT Ablation with Dr. Lovena Le per his OV note from 04/09/20.   Pt scheduled for Apr 29, 2020 at 7:30 am arrival 5:30am.   Labs to be done prior 04/24/20.  Pt verbalized understanding of her instructions and will call if she has any further questions. Letter sent to pts My Chart.   Sent to [facility] 6/60/60.

## 2020-04-10 NOTE — Telephone Encounter (Signed)
Patient calling to schedule ablation per conversation at her appt yesterday with Dr. Lovena Le.

## 2020-04-11 NOTE — Telephone Encounter (Signed)
Placed orders   Work up complete

## 2020-04-17 DIAGNOSIS — E785 Hyperlipidemia, unspecified: Secondary | ICD-10-CM | POA: Diagnosis not present

## 2020-04-20 ENCOUNTER — Telehealth: Payer: Self-pay | Admitting: Cardiology

## 2020-04-20 NOTE — Telephone Encounter (Signed)
Spoke with patient.  Discussed recent monitor with Dr Lovena Le, no evidence of AF/AFL, suspect SVT only.  Will discontinue Eliquis.

## 2020-04-24 ENCOUNTER — Other Ambulatory Visit: Payer: PPO | Admitting: *Deleted

## 2020-04-24 ENCOUNTER — Other Ambulatory Visit: Payer: Self-pay

## 2020-04-24 DIAGNOSIS — Z01810 Encounter for preprocedural cardiovascular examination: Secondary | ICD-10-CM | POA: Diagnosis not present

## 2020-04-24 DIAGNOSIS — I471 Supraventricular tachycardia: Secondary | ICD-10-CM

## 2020-04-25 LAB — BASIC METABOLIC PANEL
BUN/Creatinine Ratio: 22 (ref 12–28)
BUN: 19 mg/dL (ref 8–27)
CO2: 27 mmol/L (ref 20–29)
Calcium: 9.3 mg/dL (ref 8.7–10.3)
Chloride: 98 mmol/L (ref 96–106)
Creatinine, Ser: 0.85 mg/dL (ref 0.57–1.00)
GFR calc Af Amer: 81 mL/min/{1.73_m2} (ref >59)
GFR calc non Af Amer: 70 mL/min/{1.73_m2} (ref >59)
Glucose: 102 mg/dL — ABNORMAL HIGH (ref 65–99)
Potassium: 4.8 mmol/L (ref 3.5–5.2)
Sodium: 140 mmol/L (ref 134–144)

## 2020-04-25 LAB — CBC WITH DIFFERENTIAL/PLATELET
Basophils Absolute: 0.1 10*3/uL (ref 0.0–0.2)
Basos: 1 %
EOS (ABSOLUTE): 0.2 10*3/uL (ref 0.0–0.4)
Eos: 2 %
Hematocrit: 42.6 % (ref 34.0–46.6)
Hemoglobin: 14.2 g/dL (ref 11.1–15.9)
Immature Grans (Abs): 0 10*3/uL (ref 0.0–0.1)
Immature Granulocytes: 0 %
Lymphocytes Absolute: 1.2 10*3/uL (ref 0.7–3.1)
Lymphs: 15 %
MCH: 29.5 pg (ref 26.6–33.0)
MCHC: 33.3 g/dL (ref 31.5–35.7)
MCV: 89 fL (ref 79–97)
Monocytes Absolute: 0.5 10*3/uL (ref 0.1–0.9)
Monocytes: 6 %
Neutrophils Absolute: 6.3 10*3/uL (ref 1.4–7.0)
Neutrophils: 76 %
Platelets: 281 10*3/uL (ref 150–450)
RBC: 4.81 x10E6/uL (ref 3.77–5.28)
RDW: 14.2 % (ref 11.7–15.4)
WBC: 8.2 10*3/uL (ref 3.4–10.8)

## 2020-04-26 ENCOUNTER — Institutional Professional Consult (permissible substitution): Payer: PPO | Admitting: Internal Medicine

## 2020-04-29 ENCOUNTER — Ambulatory Visit (HOSPITAL_COMMUNITY)
Admission: RE | Admit: 2020-04-29 | Discharge: 2020-04-29 | Disposition: A | Discharge status: Home / Self Care | Payer: PPO | Attending: Internal Medicine | Admitting: Internal Medicine

## 2020-04-29 ENCOUNTER — Other Ambulatory Visit: Payer: Self-pay

## 2020-04-29 ENCOUNTER — Encounter (HOSPITAL_COMMUNITY)
Admission: RE | Disposition: A | Discharge status: Home / Self Care | Payer: Self-pay | Source: Home / Self Care | Attending: Internal Medicine

## 2020-04-29 DIAGNOSIS — Z6841 Body Mass Index (BMI) 40.0 and over, adult: Secondary | ICD-10-CM | POA: Insufficient documentation

## 2020-04-29 DIAGNOSIS — Z20822 Contact with and (suspected) exposure to covid-19: Secondary | ICD-10-CM | POA: Insufficient documentation

## 2020-04-29 DIAGNOSIS — F329 Major depressive disorder, single episode, unspecified: Secondary | ICD-10-CM | POA: Insufficient documentation

## 2020-04-29 DIAGNOSIS — Z79899 Other long term (current) drug therapy: Secondary | ICD-10-CM | POA: Diagnosis not present

## 2020-04-29 DIAGNOSIS — Z7901 Long term (current) use of anticoagulants: Secondary | ICD-10-CM | POA: Diagnosis not present

## 2020-04-29 DIAGNOSIS — I471 Supraventricular tachycardia: Secondary | ICD-10-CM | POA: Diagnosis not present

## 2020-04-29 HISTORY — PX: SVT ABLATION: EP1225

## 2020-04-29 LAB — SARS CORONAVIRUS 2 BY RT PCR (HOSPITAL ORDER, PERFORMED IN ~~LOC~~ HOSPITAL LAB): SARS Coronavirus 2: NEGATIVE

## 2020-04-29 SURGERY — SVT ABLATION

## 2020-04-29 MED ORDER — SODIUM CHLORIDE 0.9% FLUSH: 3.0000 mL | Freq: Two times a day (BID) | INTRAVENOUS | Status: DC

## 2020-04-29 MED ORDER — FENTANYL CITRATE (PF) 100 MCG/2ML IJ SOLN
INTRAMUSCULAR | Status: AC
  Filled 2020-04-29: qty 2

## 2020-04-29 MED ORDER — FENTANYL CITRATE (PF) 100 MCG/2ML IJ SOLN
INTRAMUSCULAR | Status: DC | PRN
  Administered 2020-04-29 (×2): 12.5 ug via INTRAVENOUS
  Administered 2020-04-29 (×3): 25 ug via INTRAVENOUS
  Administered 2020-04-29: 12.5 ug via INTRAVENOUS
  Administered 2020-04-29 (×2): 25 ug via INTRAVENOUS

## 2020-04-29 MED ORDER — BUPIVACAINE HCL (PF) 0.25 % IJ SOLN
INTRAMUSCULAR | Status: DC | PRN
  Administered 2020-04-29: 60 mL

## 2020-04-29 MED ORDER — HEPARIN (PORCINE) IN NACL 2-0.9 UNITS/ML
INTRAMUSCULAR | Status: AC | PRN
  Administered 2020-04-29: 500 mL

## 2020-04-29 MED ORDER — MIDAZOLAM HCL 5 MG/5ML IJ SOLN
INTRAMUSCULAR | Status: AC
  Filled 2020-04-29: qty 5

## 2020-04-29 MED ORDER — SODIUM CHLORIDE 0.9% FLUSH: 3.0000 mL | INTRAVENOUS | Status: DC | PRN

## 2020-04-29 MED ORDER — BUPIVACAINE HCL (PF) 0.25 % IJ SOLN
INTRAMUSCULAR | Status: AC
  Filled 2020-04-29: qty 30

## 2020-04-29 MED ORDER — ISOPROTERENOL HCL 0.2 MG/ML IJ SOLN
INTRAMUSCULAR | Status: AC
  Filled 2020-04-29: qty 5

## 2020-04-29 MED ORDER — SODIUM CHLORIDE 0.9 % IV SOLN: INTRAVENOUS | Status: DC

## 2020-04-29 MED ORDER — ONDANSETRON HCL 4 MG/2ML IJ SOLN: 4.0000 mg | Freq: Four times a day (QID) | INTRAMUSCULAR | Status: DC | PRN

## 2020-04-29 MED ORDER — BUPIVACAINE HCL (PF) 0.25 % IJ SOLN
INTRAMUSCULAR | Status: AC
  Filled 2020-04-29: qty 60

## 2020-04-29 MED ORDER — HEPARIN (PORCINE) IN NACL 1000-0.9 UT/500ML-% IV SOLN
INTRAVENOUS | Status: AC
  Filled 2020-04-29: qty 500

## 2020-04-29 MED ORDER — MIDAZOLAM HCL 5 MG/5ML IJ SOLN
INTRAMUSCULAR | Status: DC | PRN
  Administered 2020-04-29 (×2): 1 mg via INTRAVENOUS
  Administered 2020-04-29: 2 mg via INTRAVENOUS
  Administered 2020-04-29: 1 mg via INTRAVENOUS
  Administered 2020-04-29: 2 mg via INTRAVENOUS
  Administered 2020-04-29 (×3): 1 mg via INTRAVENOUS

## 2020-04-29 MED ORDER — SODIUM CHLORIDE 0.9 % IV SOLN: 250.0000 mL | INTRAVENOUS | Status: DC | PRN

## 2020-04-29 MED ORDER — ACETAMINOPHEN 325 MG PO TABS
650.0000 mg | ORAL_TABLET | ORAL | Status: DC | PRN
  Filled 2020-04-29: qty 2

## 2020-04-29 MED ORDER — SODIUM CHLORIDE 0.9 % IV SOLN
INTRAVENOUS | Status: DC | PRN
  Administered 2020-04-29: 2 ug/min via INTRAVENOUS

## 2020-04-29 SURGICAL SUPPLY — 14 items
BAG SNAP BAND KOVER 36X36 (MISCELLANEOUS) ×3 IMPLANT
CATH EZ STEER NAV 4MM F-J CUR (ABLATOR) ×3 IMPLANT
CATH JOSEPH QUAD ALLRED 6F REP (CATHETERS) ×6 IMPLANT
CATH WEBSTER BI DIR CS D-F CRV (CATHETERS) ×3 IMPLANT
PACK EP LATEX FREE (CUSTOM PROCEDURE TRAY) ×3
PACK EP LF (CUSTOM PROCEDURE TRAY) ×1 IMPLANT
PAD PRO RADIOLUCENT 2001M-C (PAD) ×3 IMPLANT
PATCH CARTO3 (PAD) ×3 IMPLANT
SHEATH INTRO SL0 8.5F 63 (SHEATH) ×3 IMPLANT
SHEATH PINNACLE 6F 10CM (SHEATH) ×6 IMPLANT
SHEATH PINNACLE 7F 10CM (SHEATH) ×3 IMPLANT
SHEATH PINNACLE 8F 10CM (SHEATH) ×3 IMPLANT
SHEATH PINNACLE 9F 10CM (SHEATH) ×3 IMPLANT
SHEATH PROBE COVER 6X72 (BAG) ×3 IMPLANT

## 2020-04-29 NOTE — Interval H&P Note (Signed)
History and Physical Interval Note:  04/29/2020 9:54 AM  Brittany Villegas  has presented today for surgery, with the diagnosis of SVT.  The various methods of treatment have been discussed with the patient and family. After consideration of risks, benefits and other options for treatment, the patient has consented to  Procedure(s): SVT ABLATION (N/A) as a surgical intervention.  The patient's history has been reviewed, patient examined, no change in status, stable for surgery.  I have reviewed the patient's chart and labs.  Questions were answered to the patient's satisfaction.     Cristopher Peru

## 2020-04-29 NOTE — Progress Notes (Signed)
Site area: rt groin 43fv sheaths pulled by Carlean Jews. Left groin 2 fv sheaths pulled by Hassell Done  Site Prior to Removal:  Level 0 Pressure Applied For: 15 minutes each side Manual:   yes Patient Status During Pull:  stable Post Pull Site:  Level 0 Post Pull Instructions Given:  yes Post Pull Pulses Present: bilateral dp dopplered Dressing Applied:  Gauze and tegaderm Bedrest begins @ 1020 Comments: IV saline locked

## 2020-04-29 NOTE — Progress Notes (Addendum)
Client up and walked and tolerated well; bilat groins stable, no bleeding or hematoma; Dr Lovena Le in and ok to d/c home

## 2020-04-29 NOTE — Interval H&P Note (Signed)
History and Physical Interval Note:  04/29/2020 7:26 AM  Brittany Villegas  has presented today for surgery, with the diagnosis of SVT.  The various methods of treatment have been discussed with the patient and family. After consideration of risks, benefits and other options for treatment, the patient has consented to  Procedure(s): SVT ABLATION (N/A) as a surgical intervention.  The patient's history has been reviewed, patient examined, no change in status, stable for surgery.  I have reviewed the patient's chart and labs.  Questions were answered to the patient's satisfaction.     Cristopher Peru

## 2020-04-29 NOTE — Discharge Instructions (Signed)
Cardiac Ablation, Care After This sheet gives you information about how to care for yourself after your procedure. Your health care provider may also give you more specific instructions. If you have problems or questions, contact your health care provider. What can I expect after the procedure? After the procedure, it is common to have:  Bruising around your puncture site.  Tenderness around your puncture site.  Skipped heartbeats.  Tiredness (fatigue). Follow these instructions at home: Puncture site care   Follow instructions from your health care provider about how to take care of your puncture site. Make sure you: ? Wash your hands with soap and water before you change your bandage (dressing). If soap and water are not available, use hand sanitizer. ? Change your dressing as told by your health care provider. ? Leave stitches (sutures), skin glue, or adhesive strips in place. These skin closures may need to stay in place for up to 2 weeks. If adhesive strip edges start to loosen and curl up, you may trim the loose edges. Do not remove adhesive strips completely unless your health care provider tells you to do that.  Check your puncture site every day for signs of infection. Check for: ? Redness, swelling, or pain. ? Fluid or blood. If your puncture site starts to bleed, lie down on your back, apply firm pressure to the area, and contact your health care provider. ? Warmth. ? Pus or a bad smell. Driving  Ask your health care provider when it is safe for you to drive again after the procedure.  Do not drive or use heavy machinery while taking prescription pain medicine.  Do not drive for 24 hours if you were given a medicine to help you relax (sedative) during your procedure. Activity  Avoid activities that take a lot of effort for at least 3 days after your procedure.  Do not lift anything that is heavier than 10 lb (4.5 kg), or the limit that you are told, until your health  care provider says that it is safe.  Return to your normal activities as told by your health care provider. Ask your health care provider what activities are safe for you. General instructions  Take over-the-counter and prescription medicines only as told by your health care provider.  Do not use any products that contain nicotine or tobacco, such as cigarettes and e-cigarettes. If you need help quitting, ask your health care provider.  Do not take baths, swim, or use a hot tub until your health care provider approves.  Do not drink alcohol for 24 hours after your procedure.  Keep all follow-up visits as told by your health care provider. This is important. Contact a health care provider if:  You have redness, mild swelling, or pain around your puncture site.  You have fluid or blood coming from your puncture site that stops after applying firm pressure to the area.  Your puncture site feels warm to the touch.  You have pus or a bad smell coming from your puncture site.  You have a fever.  You have chest pain or discomfort that spreads to your neck, jaw, or arm.  You are sweating a lot.  You feel nauseous.  You have a fast or irregular heartbeat.  You have shortness of breath.  You are dizzy or light-headed and feel the need to lie down.  You have pain or numbness in the arm or leg closest to your puncture site. Get help right away if:  Your puncture   site suddenly swells.  Your puncture site is bleeding and the bleeding does not stop after applying firm pressure to the area. These symptoms may represent a serious problem that is an emergency. Do not wait to see if the symptoms will go away. Get medical help right away. Call your local emergency services (911 in the [place] Do not drive yourself to the hospital. Summary  After the procedure, it is normal to have bruising and tenderness at the puncture site in your groin, neck, or forearm.  Check your puncture site every  day for signs of infection.  Get help right away if your puncture site is bleeding and the bleeding does not stop after applying firm pressure to the area. This is a medical emergency. This information is not intended to replace advice given to you by your health care provider. Make sure you discuss any questions you have with your health care provider. Document Revised: 08/20/2017 Document Reviewed: 12/17/2016 Elsevier Patient Education  2020 [employer]  

## 2020-04-30 ENCOUNTER — Encounter (HOSPITAL_COMMUNITY): Payer: Self-pay | Admitting: Internal Medicine

## 2020-05-02 DIAGNOSIS — H43392 Other vitreous opacities, left eye: Secondary | ICD-10-CM | POA: Diagnosis not present

## 2020-05-02 DIAGNOSIS — H35373 Puckering of macula, bilateral: Secondary | ICD-10-CM | POA: Diagnosis not present

## 2020-05-02 DIAGNOSIS — H40011 Open angle with borderline findings, low risk, right eye: Secondary | ICD-10-CM | POA: Diagnosis not present

## 2020-05-19 NOTE — Progress Notes (Signed)
Cardiology Office Note:    Date:  05/20/2020   ID:  Brittany Villegas, DOB 07-08-1951, MRN [id number]  PCP:  London Pepper, MD  Cardiologist:  No primary care provider on file.  Electrophysiologist:  None   Referring MD: Reatha Armour, MD   Chief Complaint  Patient presents with  . Palpitations    History of Present Illness:    Brittany Villegas is a 69 y.o. female with a hx of prediabetes, hyperlipidemia, morbid obesity depression who presents for follow-up.  She was referred by Dr. Orland Mustard for evaluation of rapid heart rate, initially seen on 12/29/2019.  Was seen by Dr. Orland Mustard on 3/18 and started on metoprolol.  She reports episodes of tachycardia for several years. Reports has been occurring since 2012. Has had 2 episodes this year. During episodes feels like heart is racing. Lasts for hours. Check pulse during last episode in was 173. Does not feel irregular. Denies any lightheadedness, syncope, or chest pain. She does report she has been having some dyspnea with exertion. Since starting metoprolol she feels very fatigued and has not been exercising. Her HR has been down to 40s.  No smoking history. Mother had mitral valve replacement at age 41, atrial fibrillation, pacemaker. Father had atrial fibrillation in 80s, pacemaker. She reports she drinks 1 cup of coffee per day. No alcohol intake.Labs from 3/18 showed normal renal function, electrolytes, TSH, blood counts.  A1c 6.0.  TTE on 01/19/2020 showed LVEF 60 to 65%, normal RV function, mild mitral regurgitation, mild ascending aortic dilatation measuring 39 mm.  CTA chest on 02/12/2020 showed mildly ectatic ascending thoracic aorta measuring 39 mm.  Cardiac monitor on 02/07/2020 showed multiple short episodes of SVT, as well as a 3-hour episode of what was recorded as atrial fibrillation, but appears more likely SVT.  She was unable to tolerate low-dose metoprolol due to baseline bradycardia.  Referred to EP and underwent successful SVT ablation on  04/29/2020.  Since last clinic visit, she reports that she has been doing well.  States that since her ablation she has not had any episodes of sustained palpitations.  She has had a few short episodes of palpitations lasting seconds.  She denies any shortness of breath or chest pain.  Denies any lightheadedness, syncope, or lower extremity edema.  Reports has been checking BP at home and  has been 110s to 120s over 70s.   Past Medical History:  Diagnosis Date  . Depression     Past Surgical History:  Procedure Laterality Date  . DILATION AND CURETTAGE OF UTERUS    . HOT HEMOSTASIS  01/13/2012   Procedure: HOT HEMOSTASIS (ARGON PLASMA COAGULATION/BICAP);  Surgeon: Arta Silence, MD;  Location: [facility];  Service: Endoscopy;  Laterality: N/A;  . SVT ABLATION N/A 04/29/2020   Procedure: SVT ABLATION;  Surgeon: Evans Lance, MD;  Location: [facility];  Service: Cardiovascular;  Laterality: N/A;    Current Medications: Current Meds  Medication Sig  . atorvastatin (LIPITOR) 10 MG tablet Take 5 mg by mouth daily.  Marland Kitchen ketotifen (ALAWAY) 0.025 % ophthalmic solution Place 1 drop into both eyes 2 (two) times daily as needed (dry eyes).  . metroNIDAZOLE (METROCREAM) 0.75 % cream Apply 1 application topically 2 (two) times daily.  . Multiple Vitamin (MULTIVITAMIN WITH MINERALS) TABS tablet Take 1 tablet by mouth daily.  . sertraline (ZOLOFT) 100 MG tablet Take 100 mg by mouth daily.  . [DISCONTINUED] pravastatin (PRAVACHOL) 10 MG tablet Take 10 mg by mouth daily.  Allergies:   Patient has no known allergies.   Social History   Socioeconomic History  . Marital status: Single    Spouse name: Not on file  . Number of children: Not on file  . Years of education: Not on file  . Highest education level: Not on file  Occupational History  . Not on file  Tobacco Use  . Smoking status: Never Smoker  . Smokeless tobacco: Never Used  Vaping Use  . Vaping Use: Never used   Substance and Sexual Activity  . Alcohol use: Yes  . Drug use: No  . Sexual activity: Not on file  Other Topics Concern  . Not on file  Social History Narrative  . Not on file   Social Determinants of Health   Financial Resource Strain:   . Difficulty of Paying Living Expenses: Not on file  Food Insecurity:   . Worried About Charity fundraiser in the Last Year: Not on file  . Ran Out of Food in the Last Year: Not on file  Transportation Needs:   . Lack of Transportation (Medical): Not on file  . Lack of Transportation (Non-Medical): Not on file  Physical Activity:   . Days of Exercise per Week: Not on file  . Minutes of Exercise per Session: Not on file  Stress:   . Feeling of Stress : Not on file  Social Connections:   . Frequency of Communication with Friends and Family: Not on file  . Frequency of Social Gatherings with Friends and Family: Not on file  . Attends Religious Services: Not on file  . Active Member of Clubs or Organizations: Not on file  . Attends Archivist Meetings: Not on file  . Marital Status: Not on file     Family History: The patient's family history is not on file.  ROS:   Please see the history of present illness.     All other systems reviewed and are negative.  EKGs/Labs/Other Studies Reviewed:    The following studies were reviewed today:   EKG:  EKG is  ordered today.  The ekg ordered today demonstrates sinus rhythm, rate 80, left axis deviation, nonspecific T wave flattening, poor R wave progression   Recent Labs: 04/24/2020: BUN 19; Creatinine, Ser 0.85; Hemoglobin 14.2; Platelets 281; Potassium 4.8; Sodium 140  Recent Lipid Panel No results found for: CHOL, TRIG, HDL, CHOLHDL, VLDL, LDLCALC, LDLDIRECT  Physical Exam:    VS:  BP 122/90 (BP Location: Left Arm, Patient Position: Sitting, Cuff Size: Large)   Pulse 80   Ht 5\' 3"  (1.6 m)   Wt 251 lb (113.9 kg)   BMI 44.46 kg/m     Wt Readings from Last 3 Encounters:   05/20/20 251 lb (113.9 kg)  04/29/20 253 lb (114.8 kg)  04/09/20 261 lb (118.4 kg)     GEN:  in no acute distress HEENT: Normal NECK: No JVD; No carotid bruits CARDIAC: RRR, no murmurs, rubs, gallops RESPIRATORY:  Clear to auscultation without rales, wheezing or rhonchi  ABDOMEN: Soft, non-tender, non-distended MUSCULOSKELETAL:  No edema; No deformity  SKIN: Warm and dry NEUROLOGIC:  Alert and oriented x 3 PSYCHIATRIC:  Normal affect   ASSESSMENT:    1. SVT (supraventricular tachycardia) ([place])   2. Essential hypertension   3. Ascending aorta dilatation ([facility])   4. Hyperlipidemia, unspecified hyperlipidemia type   5. Snoring    PLAN:    SVT: Cardiac monitor on 02/07/2020 showed multiple short episodes of SVT,  as well as a 3-hour episode of what was recorded as atrial fibrillation, but appears more likely SVT.  She was unable to tolerate low-dose metoprolol due to baseline bradycardia.  Referred to EP and underwent successful SVT ablation on 04/29/2020. -Follows with Dr Lovena Le in EP  Hypertension: On amlodipine 5 mg daily.  Mildly elevated in clinic today, but reports well controlled when checks at home.  Continue to monitor.  Snoring: Will check sleep study as above  Dilated thoracic aorta: Ascending aorta measures 39 mm on CTA, will follow up with [facility] in 1 year for monitoring  Hyperlipidemia: LDL 117 on 02/01/2020.  10-year ASCVD risk score 14%.  Recently switched from pravastatin to atorvastatin by PCP  RTC in 1 year   Medication Adjustments/Labs and Tests Ordered: Current medicines are reviewed at length with the patient today.  Concerns regarding medicines are outlined above.  Orders Placed This Encounter  Procedures  . EKG 12-Lead   No orders of the defined types were placed in this encounter.   Patient Instructions  Medication Instructions:  Your physician recommends that you continue on your current medications as directed. Please refer to the Current Medication  list given to you today.  *If you need a refill on your cardiac medications before your next appointment, please call your pharmacy*  Follow-Up: At [facility], you and your health needs are our priority.  As part of our continuing mission to provide you with exceptional heart care, we have created designated Provider Care Teams.  These Care Teams include your primary Cardiologist (physician) and Advanced Practice Providers (APPs -  Physician Assistants and Nurse Practitioners) who all work together to provide you with the care you need, when you need it.  We recommend signing up for the patient portal called "MyChart".  Sign up information is provided on this After Visit Summary.  MyChart is used to connect with patients for Virtual Visits (Telemedicine).  Patients are able to view lab/test results, encounter notes, upcoming appointments, etc.  Non-urgent messages can be sent to your provider as well.   To learn more about what you can do with MyChart, go to [email or web address].    Your next appointment:   12 month(s)  The format for your next appointment:   In Person  Provider:   Oswaldo Milian, MD   Other Instructions I will follow up with our sleep coordinator about the sleep study ordered.     Signed, Donato Heinz, MD  05/20/2020 9:21 AM    [place]

## 2020-05-20 ENCOUNTER — Telehealth: Payer: Self-pay | Admitting: *Deleted

## 2020-05-20 ENCOUNTER — Ambulatory Visit: Payer: PPO | Admitting: Cardiology

## 2020-05-20 ENCOUNTER — Encounter: Payer: Self-pay | Admitting: Cardiology

## 2020-05-20 ENCOUNTER — Other Ambulatory Visit: Payer: Self-pay

## 2020-05-20 VITALS — BP 122/90 | HR 80 | Ht 63.0 in | Wt 251.0 lb

## 2020-05-20 DIAGNOSIS — I471 Supraventricular tachycardia: Secondary | ICD-10-CM

## 2020-05-20 DIAGNOSIS — I77810 Thoracic aortic ectasia: Secondary | ICD-10-CM

## 2020-05-20 DIAGNOSIS — R0683 Snoring: Secondary | ICD-10-CM | POA: Diagnosis not present

## 2020-05-20 DIAGNOSIS — E785 Hyperlipidemia, unspecified: Secondary | ICD-10-CM | POA: Diagnosis not present

## 2020-05-20 DIAGNOSIS — I10 Essential (primary) hypertension: Secondary | ICD-10-CM

## 2020-05-20 NOTE — Patient Instructions (Signed)
Medication Instructions:  Your physician recommends that you continue on your current medications as directed. Please refer to the Current Medication list given to you today.  *If you need a refill on your cardiac medications before your next appointment, please call your pharmacy*  Follow-Up: At [facility], you and your health needs are our priority.  As part of our continuing mission to provide you with exceptional heart care, we have created designated Provider Care Teams.  These Care Teams include your primary Cardiologist (physician) and Advanced Practice Providers (APPs -  Physician Assistants and Nurse Practitioners) who all work together to provide you with the care you need, when you need it.  We recommend signing up for the patient portal called "MyChart".  Sign up information is provided on this After Visit Summary.  MyChart is used to connect with patients for Virtual Visits (Telemedicine).  Patients are able to view lab/test results, encounter notes, upcoming appointments, etc.  Non-urgent messages can be sent to your provider as well.   To learn more about what you can do with MyChart, go to [email or web address].    Your next appointment:   12 month(s)  The format for your next appointment:   In Person  Provider:   Oswaldo Milian, MD   Other Instructions I will follow up with our sleep coordinator about the sleep study ordered.

## 2020-05-20 NOTE — Telephone Encounter (Signed)
PA request for in lab sleep study submitted to [facility] via web portal.

## 2020-05-28 ENCOUNTER — Telehealth: Payer: Self-pay | Admitting: *Deleted

## 2020-05-28 NOTE — Telephone Encounter (Signed)
Opened in error

## 2020-05-28 NOTE — Telephone Encounter (Signed)
Patient notified of sleep study appointment. 

## 2020-05-30 ENCOUNTER — Encounter: Payer: Self-pay | Admitting: Internal Medicine

## 2020-05-30 ENCOUNTER — Other Ambulatory Visit: Payer: Self-pay

## 2020-05-30 ENCOUNTER — Ambulatory Visit: Payer: PPO | Admitting: Internal Medicine

## 2020-05-30 DIAGNOSIS — I471 Supraventricular tachycardia: Secondary | ICD-10-CM

## 2020-05-30 NOTE — Patient Instructions (Addendum)
Medication Instructions:  Your physician recommends that you continue on your current medications as directed. Please refer to the Current Medication list given to you today.  Labwork: None ordered.  Testing/Procedures: None ordered.  Follow-Up: Your physician wants you to follow-up in: as needed with Dr. Taylor.      Any Other Special Instructions Will Be Listed Below (If Applicable).  If you need a refill on your cardiac medications before your next appointment, please call your pharmacy.   

## 2020-05-30 NOTE — Progress Notes (Signed)
HPI Brittany Villegas returns today for followup. She is a pleasant morbidly obese woman with SVT s/p catheter ablation 4 weeks ago. She feels at times like her heart wants to start beating fast but does not. She denies chest pain or sob. She has lost 39 lbs over the past year. She is on weight watchers.  No Known Allergies   Current Outpatient Medications  Medication Sig Dispense Refill  . amLODipine (NORVASC) 10 MG tablet Take 10 mg by mouth daily.    Marland Kitchen atorvastatin (LIPITOR) 10 MG tablet Take 5 mg by mouth daily.    Marland Kitchen ketotifen (ALAWAY) 0.025 % ophthalmic solution Place 1 drop into both eyes 2 (two) times daily as needed (dry eyes).    . metroNIDAZOLE (METROCREAM) 0.75 % cream Apply 1 application topically 2 (two) times daily.    . Multiple Vitamin (MULTIVITAMIN WITH MINERALS) TABS tablet Take 1 tablet by mouth daily.    . sertraline (ZOLOFT) 100 MG tablet Take 100 mg by mouth daily.     No current facility-administered medications for this visit.     Past Medical History:  Diagnosis Date  . Depression     ROS:   All systems reviewed and negative except as noted in the HPI.   Past Surgical History:  Procedure Laterality Date  . DILATION AND CURETTAGE OF UTERUS    . HOT HEMOSTASIS  01/13/2012   Procedure: HOT HEMOSTASIS (ARGON PLASMA COAGULATION/BICAP);  Surgeon: Arta Silence, MD;  Location: [facility];  Service: Endoscopy;  Laterality: N/A;  . SVT ABLATION N/A 04/29/2020   Procedure: SVT ABLATION;  Surgeon: Evans Lance, MD;  Location: [facility];  Service: Cardiovascular;  Laterality: N/A;     History reviewed. No pertinent family history.   Social History   Socioeconomic History  . Marital status: Single    Spouse name: Not on file  . Number of children: Not on file  . Years of education: Not on file  . Highest education level: Not on file  Occupational History  . Not on file  Tobacco Use  . Smoking status: Never Smoker  . Smokeless tobacco:  Never Used  Vaping Use  . Vaping Use: Never used  Substance and Sexual Activity  . Alcohol use: Yes  . Drug use: No  . Sexual activity: Not on file  Other Topics Concern  . Not on file  Social History Narrative  . Not on file   Social Determinants of Health   Financial Resource Strain:   . Difficulty of Paying Living Expenses: Not on file  Food Insecurity:   . Worried About Charity fundraiser in the Last Year: Not on file  . Ran Out of Food in the Last Year: Not on file  Transportation Needs:   . Lack of Transportation (Medical): Not on file  . Lack of Transportation (Non-Medical): Not on file  Physical Activity:   . Days of Exercise per Week: Not on file  . Minutes of Exercise per Session: Not on file  Stress:   . Feeling of Stress : Not on file  Social Connections:   . Frequency of Communication with Friends and Family: Not on file  . Frequency of Social Gatherings with Friends and Family: Not on file  . Attends Religious Services: Not on file  . Active Member of Clubs or Organizations: Not on file  . Attends Archivist Meetings: Not on file  . Marital Status: Not on file  Intimate Partner Violence:   . Fear of Current or Ex-Partner: Not on file  . Emotionally Abused: Not on file  . Physically Abused: Not on file  . Sexually Abused: Not on file     BP 136/82   Pulse 98   Ht 5\' 3"  (1.6 m)   Wt 248 lb (112.5 kg)   SpO2 97%   BMI 43.93 kg/m   Physical Exam:  Obese but well appearing NAD HEENT: Unremarkable Neck:  6 cm JVD, no thyromegally Lymphatics:  No adenopathy Back:  No CVA tenderness Lungs:  Clear with no wheezes HEART:  Regular rate rhythm, no murmurs, no rubs, no clicks Abd:  soft, positive bowel sounds, no organomegally, no rebound, no guarding Ext:  2 plus pulses, no edema, no cyanosis, no clubbing Skin:  No rashes no nodules Neuro:  CN II through XII intact, motor grossly intact  EKG - none   Assess/Plan: 1. SVT - she is s/p  slow pathway modification and is doing well. She will undergo watchful waiting. 2. Obesity - she is encouraged to continue to lose weight.   Carleene Overlie Ashlin Kreps,MD

## 2020-06-07 DIAGNOSIS — I10 Essential (primary) hypertension: Secondary | ICD-10-CM | POA: Diagnosis not present

## 2020-06-07 DIAGNOSIS — Z23 Encounter for immunization: Secondary | ICD-10-CM | POA: Diagnosis not present

## 2020-06-07 DIAGNOSIS — E041 Nontoxic single thyroid nodule: Secondary | ICD-10-CM | POA: Diagnosis not present

## 2020-06-07 DIAGNOSIS — F419 Anxiety disorder, unspecified: Secondary | ICD-10-CM | POA: Diagnosis not present

## 2020-06-28 ENCOUNTER — Other Ambulatory Visit: Payer: Self-pay

## 2020-06-28 ENCOUNTER — Ambulatory Visit (HOSPITAL_BASED_OUTPATIENT_CLINIC_OR_DEPARTMENT_OTHER): Payer: PPO | Attending: Cardiology | Admitting: Cardiovascular Disease

## 2020-06-28 DIAGNOSIS — R0683 Snoring: Secondary | ICD-10-CM | POA: Diagnosis not present

## 2020-06-28 DIAGNOSIS — I493 Ventricular premature depolarization: Secondary | ICD-10-CM | POA: Diagnosis not present

## 2020-06-28 DIAGNOSIS — Z79899 Other long term (current) drug therapy: Secondary | ICD-10-CM | POA: Insufficient documentation

## 2020-06-28 DIAGNOSIS — G4733 Obstructive sleep apnea (adult) (pediatric): Secondary | ICD-10-CM | POA: Diagnosis not present

## 2020-06-28 DIAGNOSIS — R0902 Hypoxemia: Secondary | ICD-10-CM | POA: Insufficient documentation

## 2020-06-28 DIAGNOSIS — I480 Paroxysmal atrial fibrillation: Secondary | ICD-10-CM | POA: Insufficient documentation

## 2020-07-23 ENCOUNTER — Encounter (HOSPITAL_BASED_OUTPATIENT_CLINIC_OR_DEPARTMENT_OTHER): Payer: Self-pay | Admitting: Cardiovascular Disease

## 2020-07-23 ENCOUNTER — Telehealth: Payer: Self-pay | Admitting: *Deleted

## 2020-07-23 DIAGNOSIS — G4733 Obstructive sleep apnea (adult) (pediatric): Secondary | ICD-10-CM

## 2020-07-23 NOTE — Telephone Encounter (Signed)
-----   Message from Troy Sine, MD sent at 07/23/2020  8:34 AM EDT ----- Gae Bon, please notify pt and schedule for CPAP titration study

## 2020-07-23 NOTE — Procedures (Signed)
Patient Name: Brittany Villegas, Brittany Villegas Date: 06/28/2020 Gender: Female D.O.B: 19-Apr-1951 Age (years): 68 Referring Provider: Oswaldo Milian Height (inches): 63 Interpreting Physician: Shelva Majestic MD, ABSM Weight (lbs): 240 RPSGT: Baxter Flattery BMI: 43 MRN: [id number] Neck Size: 16.00  CLINICAL INFORMATION Sleep Study Type: NPSG  Indication for sleep study: Obesity, Snoring, Witnesses Apnea / Gasping During Sleep  Epworth Sleepiness Score: 4  SLEEP STUDY TECHNIQUE As per the AASM Manual for the Scoring of Sleep and Associated Events v2.3 (April 2016) with a hypopnea requiring 4% desaturations.  The channels recorded and monitored were frontal, central and occipital EEG, electrooculogram (EOG), submentalis EMG (chin), nasal and oral airflow, thoracic and abdominal wall motion, anterior tibialis EMG, snore microphone, electrocardiogram, and pulse oximetry.  MEDICATIONS amLODipine (NORVASC) 10 MG tablet atorvastatin (LIPITOR) 10 MG tablet ketotifen (ALAWAY) 0.025 % ophthalmic solution metroNIDAZOLE (METROCREAM) 0.75 % cream Multiple Vitamin (MULTIVITAMIN WITH MINERALS) TABS tablet sertraline (ZOLOFT) 100 MG tablet Medications self-administered by patient taken the night of the study : N/A  SLEEP ARCHITECTURE The study was initiated at 10:46:57 PM and ended at 4:46:32 AM.  Sleep onset time was 15.8 minutes and the sleep efficiency was 82.7%%. The total sleep time was 297.5 minutes.  Stage REM latency was 255.0 minutes.  The patient spent 8.4%% of the night in stage N1 sleep, 81.0%% in stage N2 sleep, 0.0%% in stage N3 and 10.6% in REM.  Alpha intrusion was absent.  Supine sleep was 41.01%.  RESPIRATORY PARAMETERS The overall apnea/hypopnea index (AHI) was 36.7 per hour. The respiratory disturbance index (RDI) was 37.1/h. There were 54 total apneas, including 54 obstructive, 0 central and 0 mixed apneas. There were 128 hypopneas and 2 RERAs.  The AHI during  Stage REM sleep was 64.8 per hour.  AHI while supine was 66.9 per hour.  The mean oxygen saturation was 92.4%. The minimum SpO2 during sleep was 84.0%.  Loud snoring was noted during this study.  CARDIAC DATA The 2 lead EKG demonstrated sinus rhythm. The mean heart rate was 47.2 beats per minute. Other EKG findings include: PACs, PVCs.  LEG MOVEMENT DATA The total PLMS were 0 with a resulting PLMS index of 0.0. Associated arousal with leg movement index was 0.0 .  IMPRESSIONS - Severe obstructive sleep apnea occurred during this study (AHI 36.7/h); events were more severe duirng REM sleep (AHI 64.8/h) and with supine sleep (AHI 66.9/h) - No significant central sleep apnea occurred during this study (CAI = 0.0/h). - Moderate oxygen desaturation was noted during this study (Min O2 = 84.0%). - The patient snored with loud snoring volume. - EKG findings include PACs, PVCs. - Clinically significant periodic limb movements did not occur during sleep. No significant associated arousals.  DIAGNOSIS - Obstructive Sleep Apnea (G47.33) - Nocturnal Hypoxemia (G47.36)  RECOMMENDATIONS - Therapeutic CPAP titration to determine optimal pressure required to alleviate sleep disordered breathing. - Effort should be made to optimize nasal and oropharyngeal patency. - Positional therapy avoiding supine position during sleep. - Avoid alcohol, sedatives and other CNS depressants that may worsen sleep apnea and disrupt normal sleep architecture. - Sleep hygiene should be reviewed to assess factors that may improve sleep quality. - Weight management (BMI 43) and regular exercise should be initiated or continued if appropriate.  [Electronically signed] 07/23/2020 08:26 AM  Shelva Majestic MD, [facility], [place], American Board of Sleep Medicine   NPI: [id number] [place] PH: [phone]   FX: [phone]  ACADEMY OF SLEEP MEDICINE

## 2020-07-23 NOTE — Telephone Encounter (Signed)
Informed patient of sleep study results and patient understanding was verbalized. Patient understands her sleep study showed :  IMPRESSIONS - Severe obstructive sleep apnea occurred during this study (AHI 36.7/h); events were more severe duirng REM sleep (AHI 64.8/h) and with supine sleep (AHI 66.9/h) - Moderate oxygen desaturation was noted during this study (Min O2 = 84.0%). - The patient snored with loud snoring volume. - EKG findings include PACs, PVCs.  DIAGNOSIS - Obstructive Sleep Apnea (G47.33) - Nocturnal Hypoxemia (G47.36)  RECOMMENDATIONS - Therapeutic CPAP titration to determine optimal pressure required to alleviate sleep disordered breathing  Titration sent to sleep pool

## 2020-07-30 ENCOUNTER — Telehealth: Payer: Self-pay | Admitting: Cardiovascular Disease

## 2020-07-30 NOTE — Telephone Encounter (Signed)
PA pending - clinical notes, order, sleep study findings are faxed to [facility]

## 2020-08-02 NOTE — Telephone Encounter (Signed)
PA approved [phone] for DOS 07-30-20 thru 10-28-20. Called patient and left message telling her the appointment is Sunday, Dec. 19 at 8 pm, they would send her an info packet and left the sleep lab number for her.

## 2020-09-02 ENCOUNTER — Telehealth: Payer: Self-pay | Admitting: *Deleted

## 2020-09-02 NOTE — Telephone Encounter (Signed)
-----   Message from Tye Savoy sent at 07/30/2020  4:07 PM EST ----- Regarding: RE: precert PA pending - clinical notes, order, sleep study findings are faxed to [place] ----- Message ----- From: Freada Bergeron, CMA Sent: 07/23/2020  11:07 AM EST To: [facility] Sleep Studies Subject: precert                                        Therapeutic CPAP titration

## 2020-09-08 ENCOUNTER — Ambulatory Visit (HOSPITAL_BASED_OUTPATIENT_CLINIC_OR_DEPARTMENT_OTHER): Payer: PPO | Attending: Cardiovascular Disease | Admitting: Cardiovascular Disease

## 2020-09-08 ENCOUNTER — Other Ambulatory Visit: Payer: Self-pay

## 2020-09-08 DIAGNOSIS — G4733 Obstructive sleep apnea (adult) (pediatric): Secondary | ICD-10-CM | POA: Diagnosis not present

## 2020-09-18 ENCOUNTER — Encounter (HOSPITAL_BASED_OUTPATIENT_CLINIC_OR_DEPARTMENT_OTHER): Payer: Self-pay | Admitting: Cardiovascular Disease

## 2020-09-18 NOTE — Procedures (Signed)
Patient Name: Brittany Villegas, Brittany Villegas Date: 09/08/2020 Gender: Female D.O.B: Dec 14, 1950 Age (years): 49 Referring Provider: Oswaldo Milian Height (inches): 63 Interpreting Physician: Shelva Majestic MD, ABSM Weight (lbs): 240 RPSGT: Baxter Flattery BMI: 109 MRN: [id number] Neck Size: 16.00  CLINICAL INFORMATION The patient is referred for a split night study with BPAP. Most recent polysomnogram dated 06/28/2020:  AHI of 36.7/h;  RDI of 37.1/h; REM AHI 64.8/h; supine AHI 66.9/h; O2 nadir 84%.   MEDICATIONS amLODipine (NORVASC) 10 MG tablet atorvastatin (LIPITOR) 10 MG tablet ketotifen (ALAWAY) 0.025 % ophthalmic solution metroNIDAZOLE (METROCREAM) 0.75 % cream Multiple Vitamin (MULTIVITAMIN WITH MINERALS) TABS tablet sertraline (ZOLOFT) 100 MG tablet Medications self-administered by patient taken the night of the study : N/A  SLEEP STUDY TECHNIQUE As per the AASM Manual for the Scoring of Sleep and Associated Events v2.3 (April 2016) with a hypopnea requiring 4% desaturations.  The channels recorded and monitored were frontal, central and occipital EEG, electrooculogram (EOG), submentalis EMG (chin), nasal and oral airflow, thoracic and abdominal wall motion, anterior tibialis EMG, snore microphone, electrocardiogram, and pulse oximetry. Bi-level positive airway pressure (BiPAP) was initiated when the patient met split night criteria and was titrated according to treat sleep-disordered breathing.  RESPIRATORY PARAMETERS Diagnostic Total AHI (/hr): 20.4 RDI (/hr): 20.8 OA Index (/hr): 0.9 CA Index (/hr): 0.0 REM AHI (/hr): 0.0 NREM AHI (/hr): 20.9 Supine AHI (/hr): 21.1 Non-supine AHI (/hr): 11.61 Min O2 Sat (%): 73.0 Mean O2 (%): 90.5 Time below 88% (min): 42.8   Titration Optimal IPAP Pressure (cm): 16 Optimal EPAP Pressure (cm): 12 AHI at Optimal Pressure (/hr): 0.0 Min O2 at Optimal Pressure (%): 89.0 Sleep % at Optimal (%): 100 Supine % at Optimal (%): 100    SLEEP  ARCHITECTURE The study was initiated at 10:08:53 PM and terminated at 5:09:00 AM. The total recorded time was 420.1 minutes. EEG confirmed total sleep time was 276.8 minutes yielding a sleep efficiency of 65.9%%. Sleep onset after lights out was 9.9 minutes with a REM latency of 400.5 minutes. The patient spent 17.7%% of the night in stage N1 sleep, 79.6%% in stage N2 sleep, 0.0%% in stage N3 and 2.7% in REM. Wake after sleep onset (WASO) was 133.5 minutes. The Arousal Index was 15.2/hour.  LEG MOVEMENT DATA The total Periodic Limb Movements of Sleep (PLMS) were 0. The PLMS index was 0.0 .  CARDIAC DATA The 2 lead EKG demonstrated sinus rhythm. The mean heart rate was 100.0 beats per minute. Other EKG findings include: PVCs.  IMPRESSIONS - CPAP was initiated and was titrated to 11 cm. Due to continued events with significant oxygen desataturation to a nadir of 73%, BIPAP was initiated and titrated to  BiPAP at 16/12 cm of water; AHI 0, O2 nadir 89%. - No significant central sleep apnea occurred during the diagnostic portion of the study (CAI = 0.0/hour). - No snoring was audible during this study. - EKG findings include Ventricular Tachycardia, PVCs. - Clinically significant periodic limb movements of sleep did not occur during the study.  DIAGNOSIS - Obstructive Sleep Apnea (G47.33)  RECOMMENDATIONS - With the previously documented severity in REM sleep and positional component recommend an initial trial of BiPAP Auto therapy with EPAP min of 12, PS of 4 and IPAP max of 25 cm H2O with heated humidification. A Large size Resmed Full Face Mask AirFit F20 mask was used for the titration. - Effort should be made to optimize nasal and oropharyngeal patency. - Avoid alcohol, sedatives and other CNS  depressants that may worsen sleep apnea and disrupt normal sleep architecture. - Sleep hygiene should be reviewed to assess factors that may improve sleep quality. - Weight management and regular  exercise should be initiated or continued. - Recommend a download in 30 days and sleep clinic evaluation after one month of therapy..  [Electronically signed] 09/18/2020 05:51 PM  Shelva Majestic MD, [facility], [place], American Board of Sleep Medicine   NPI: [id number] [place] PH: [phone]   FX: [phone] [place]

## 2020-09-27 ENCOUNTER — Telehealth: Payer: Self-pay | Admitting: *Deleted

## 2020-09-27 DIAGNOSIS — G4733 Obstructive sleep apnea (adult) (pediatric): Secondary | ICD-10-CM

## 2020-09-27 NOTE — Telephone Encounter (Signed)
Informed patient of sleep study results and patient understanding was verbalized. Patient understands her sleep study showed   IMPRESSIONS - CPAP was initiated and was titrated to 11 cm. Due to continued events with significant oxygen desataturation to a nadir of 73%, BIPAP was initiated and titrated to  BiPAP at 16/12 cm of water; AHI 0, O2 nadir 89%. - No significant central sleep apnea occurred during the diagnostic portion of the study (CAI = 0.0/hour). - No snoring was audible during this study. - EKG findings include Ventricular Tachycardia, PVCs. - Clinically significant periodic limb movements of sleep did not occur during the study.  DIAGNOSIS - Obstructive Sleep Apnea (G47.33)  RECOMMENDATIONS - With the previously documented severity in REM sleep and positional component recommend an initial trial of BiPAP Auto therapy with EPAP min of 12, PS of 4 and IPAP max of 25 cm H2O with heated humidification  Upon patient request DME selection is [facility]. Patient understands she/he will be contacted by [place] to set up her/he cpap. Patient understands to call if [facility] does not contact her/he with new setup in a timely manner. Patient understands they will be called once confirmation has been received from [facility] that they have received their new machine to schedule 10 week follow up appointment.   [facility] notified of new cpap order  Please add to airview Patient was grateful for the call and thanked me.

## 2020-09-27 NOTE — Telephone Encounter (Signed)
-----   Message from Troy Sine, MD sent at 09/18/2020  5:56 PM EST ----- Gae Bon, please contact pt and set up with DME for BiPAP

## 2020-10-03 NOTE — Addendum Note (Signed)
Addended by: Freada Bergeron on: 10/03/2020 10:18 AM   Modules accepted: Orders

## 2020-12-26 ENCOUNTER — Other Ambulatory Visit: Payer: Self-pay | Admitting: Cardiology

## 2020-12-26 DIAGNOSIS — R002 Palpitations: Secondary | ICD-10-CM

## 2020-12-30 ENCOUNTER — Telehealth: Payer: Self-pay | Admitting: Internal Medicine

## 2020-12-30 DIAGNOSIS — H40011 Open angle with borderline findings, low risk, right eye: Secondary | ICD-10-CM | POA: Diagnosis not present

## 2020-12-30 DIAGNOSIS — H43392 Other vitreous opacities, left eye: Secondary | ICD-10-CM | POA: Diagnosis not present

## 2020-12-30 DIAGNOSIS — H26492 Other secondary cataract, left eye: Secondary | ICD-10-CM | POA: Diagnosis not present

## 2020-12-30 DIAGNOSIS — H35373 Puckering of macula, bilateral: Secondary | ICD-10-CM | POA: Diagnosis not present

## 2020-12-30 MED ORDER — AMLODIPINE BESYLATE 10 MG PO TABS: 10.0000 mg | ORAL_TABLET | Freq: Every day | ORAL | 1 refills | Status: DC

## 2020-12-30 NOTE — Telephone Encounter (Signed)
   *  STAT* If patient is at the pharmacy, call can be transferred to refill team.   1. Which medications need to be refilled? (please list name of each medication and dose if known)   amLODipine (NORVASC) 10 MG tablet    2. Which pharmacy/location (including street and city if local pharmacy) is medication to be sent to? [place], [place] - [address] AT [address]  3. Do they need a 30 day or 90 day supply? 90 days

## 2020-12-30 NOTE — Telephone Encounter (Signed)
Pt's medication was sent to pt's pharmacy as requested. Confirmation received.  °

## 2021-01-28 DIAGNOSIS — H26492 Other secondary cataract, left eye: Secondary | ICD-10-CM | POA: Diagnosis not present

## 2021-01-28 DIAGNOSIS — H35372 Puckering of macula, left eye: Secondary | ICD-10-CM | POA: Diagnosis not present

## 2021-01-28 DIAGNOSIS — H18413 Arcus senilis, bilateral: Secondary | ICD-10-CM | POA: Diagnosis not present

## 2021-01-28 DIAGNOSIS — H02831 Dermatochalasis of right upper eyelid: Secondary | ICD-10-CM | POA: Diagnosis not present

## 2021-01-28 DIAGNOSIS — Z961 Presence of intraocular lens: Secondary | ICD-10-CM | POA: Diagnosis not present

## 2021-02-11 DIAGNOSIS — H26492 Other secondary cataract, left eye: Secondary | ICD-10-CM | POA: Diagnosis not present

## 2021-02-12 DIAGNOSIS — R7303 Prediabetes: Secondary | ICD-10-CM | POA: Diagnosis not present

## 2021-02-12 DIAGNOSIS — I10 Essential (primary) hypertension: Secondary | ICD-10-CM | POA: Diagnosis not present

## 2021-02-12 DIAGNOSIS — I77810 Thoracic aortic ectasia: Secondary | ICD-10-CM | POA: Diagnosis not present

## 2021-02-12 DIAGNOSIS — E041 Nontoxic single thyroid nodule: Secondary | ICD-10-CM | POA: Diagnosis not present

## 2021-02-12 DIAGNOSIS — E785 Hyperlipidemia, unspecified: Secondary | ICD-10-CM | POA: Diagnosis not present

## 2021-02-12 DIAGNOSIS — G4733 Obstructive sleep apnea (adult) (pediatric): Secondary | ICD-10-CM | POA: Diagnosis not present

## 2021-02-12 DIAGNOSIS — F419 Anxiety disorder, unspecified: Secondary | ICD-10-CM | POA: Diagnosis not present

## 2021-02-12 DIAGNOSIS — Z0000 Encounter for general adult medical examination without abnormal findings: Secondary | ICD-10-CM | POA: Diagnosis not present

## 2021-02-13 DIAGNOSIS — E785 Hyperlipidemia, unspecified: Secondary | ICD-10-CM | POA: Diagnosis not present

## 2021-02-13 DIAGNOSIS — R7303 Prediabetes: Secondary | ICD-10-CM | POA: Diagnosis not present

## 2021-02-13 DIAGNOSIS — I77810 Thoracic aortic ectasia: Secondary | ICD-10-CM | POA: Diagnosis not present

## 2021-02-13 DIAGNOSIS — Z0000 Encounter for general adult medical examination without abnormal findings: Secondary | ICD-10-CM | POA: Diagnosis not present

## 2021-02-13 DIAGNOSIS — E041 Nontoxic single thyroid nodule: Secondary | ICD-10-CM | POA: Diagnosis not present

## 2021-02-21 ENCOUNTER — Other Ambulatory Visit: Payer: Self-pay | Admitting: *Deleted

## 2021-02-21 ENCOUNTER — Telehealth: Payer: Self-pay | Admitting: Cardiology

## 2021-02-21 DIAGNOSIS — I471 Supraventricular tachycardia: Secondary | ICD-10-CM

## 2021-02-21 DIAGNOSIS — I10 Essential (primary) hypertension: Secondary | ICD-10-CM

## 2021-02-21 NOTE — Telephone Encounter (Signed)
Spoke with patient regarding the Monday 03/03/21 12:00pm MRA chest appointment at [facility]---arrival time is 11:30 am--1st floor admissions office for check in.  Patient to come n wither 02/25/21 or 02/26/21 for lab work.  Patient voiced her understanding

## 2021-02-27 ENCOUNTER — Other Ambulatory Visit: Payer: Self-pay | Admitting: *Deleted

## 2021-02-27 DIAGNOSIS — I10 Essential (primary) hypertension: Secondary | ICD-10-CM

## 2021-02-27 DIAGNOSIS — I471 Supraventricular tachycardia, unspecified: Secondary | ICD-10-CM

## 2021-02-27 LAB — BASIC METABOLIC PANEL
BUN/Creatinine Ratio: 24 (ref 12–28)
BUN: 18 mg/dL (ref 8–27)
CO2: 26 mmol/L (ref 20–29)
Calcium: 9.3 mg/dL (ref 8.7–10.3)
Chloride: 99 mmol/L (ref 96–106)
Creatinine, Ser: 0.75 mg/dL (ref 0.57–1.00)
Glucose: 86 mg/dL (ref 65–99)
Potassium: 4.8 mmol/L (ref 3.5–5.2)
Sodium: 143 mmol/L (ref 134–144)
eGFR: 86 mL/min/{1.73_m2} (ref >59)

## 2021-03-03 ENCOUNTER — Other Ambulatory Visit: Payer: Self-pay

## 2021-03-03 ENCOUNTER — Ambulatory Visit (HOSPITAL_COMMUNITY)
Admission: RE | Admit: 2021-03-03 | Discharge: 2021-03-03 | Disposition: A | Discharge status: Home / Self Care | Payer: PPO | Source: Ambulatory Visit | Attending: Cardiology | Admitting: Cardiology

## 2021-03-03 DIAGNOSIS — I712 Thoracic aortic aneurysm, without rupture, unspecified: Secondary | ICD-10-CM

## 2021-03-03 DIAGNOSIS — I7789 Other specified disorders of arteries and arterioles: Secondary | ICD-10-CM | POA: Diagnosis not present

## 2021-03-03 MED ORDER — GADOBUTROL 1 MMOL/ML IV SOLN
10.0000 mL | Freq: Once | INTRAVENOUS | Status: AC | PRN
  Administered 2021-03-03: 14:00:00 10 mL via INTRAVENOUS

## 2021-03-12 ENCOUNTER — Other Ambulatory Visit: Payer: Self-pay | Admitting: Family Medicine

## 2021-03-12 DIAGNOSIS — E041 Nontoxic single thyroid nodule: Secondary | ICD-10-CM

## 2021-03-21 ENCOUNTER — Ambulatory Visit
Admission: RE | Admit: 2021-03-21 | Discharge: 2021-03-21 | Disposition: A | Discharge status: Home / Self Care | Payer: PPO | Source: Ambulatory Visit | Attending: Family Medicine | Admitting: Family Medicine

## 2021-03-21 DIAGNOSIS — E041 Nontoxic single thyroid nodule: Secondary | ICD-10-CM | POA: Diagnosis not present

## 2021-05-12 ENCOUNTER — Other Ambulatory Visit: Payer: Self-pay | Admitting: Cardiology

## 2021-05-14 DIAGNOSIS — G4733 Obstructive sleep apnea (adult) (pediatric): Secondary | ICD-10-CM | POA: Diagnosis not present

## 2021-06-14 DIAGNOSIS — G4733 Obstructive sleep apnea (adult) (pediatric): Secondary | ICD-10-CM | POA: Diagnosis not present

## 2021-06-27 ENCOUNTER — Other Ambulatory Visit: Payer: Self-pay | Admitting: Internal Medicine

## 2021-07-14 DIAGNOSIS — G4733 Obstructive sleep apnea (adult) (pediatric): Secondary | ICD-10-CM | POA: Diagnosis not present

## 2021-07-15 DIAGNOSIS — H35373 Puckering of macula, bilateral: Secondary | ICD-10-CM | POA: Diagnosis not present

## 2021-07-25 ENCOUNTER — Telehealth: Payer: Self-pay | Admitting: Cardiovascular Disease

## 2021-07-25 NOTE — Telephone Encounter (Signed)
Called patient to schedule sleep compliance, she is due by 08/13/21. Patient stated that [facility] picked up her machine because she was not being compliant. She stated that [employer] needed a 60 day compliance and she did not meet it. She's not sure where to go from here, please advise.

## 2021-09-03 IMAGING — CT CT ANGIO CHEST
3 of 8 series · 18 of 46 positions shown · IV contrast (omnipaque)
Comparison: None.

CLINICAL DATA: Evaluate thoracic aortic aneurysm.

EXAM:
CT ANGIOGRAPHY CHEST WITH CONTRAST
TECHNIQUE: Multidetector CT imaging of the chest was performed using the
standard protocol during bolus administration of intravenous
contrast. Multiplanar CT image reconstructions and MIPs were
obtained to evaluate the vascular anatomy.
CONTRAST:  100mL OMNIPAQUE IOHEXOL 350 MG/ML SOLN

[Series 4: aorta 3.0 bf37 2 · axial · 0.65mm/px · z∈[-210,+22]mm · 13 of 91 slices shown]
[im 7/91  lung]
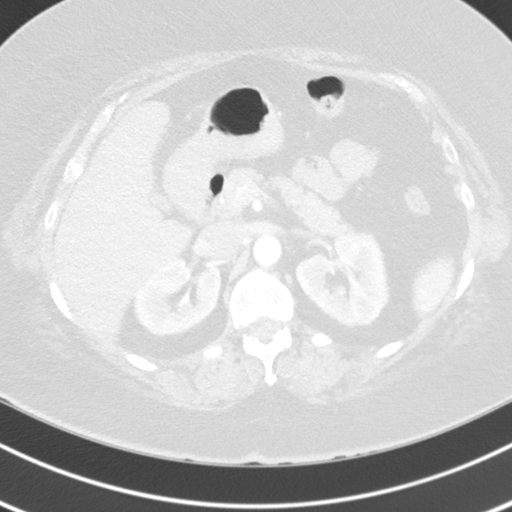
[im 13/91  soft-tissue]
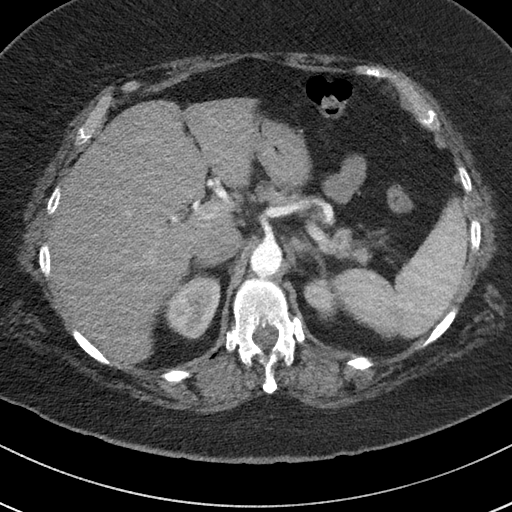
[im 20/91  lung]
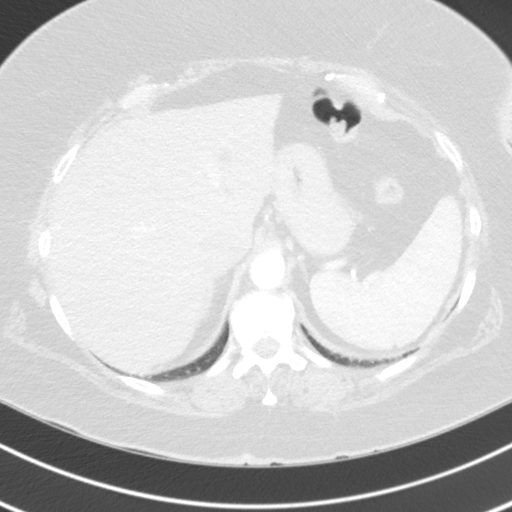
[im 26/91  soft-tissue]
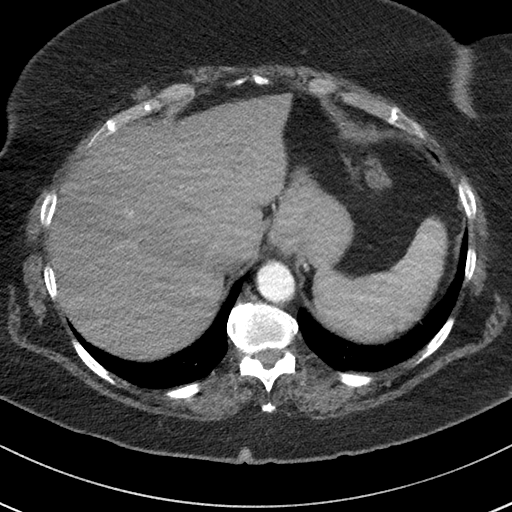
[im 33/91  lung]
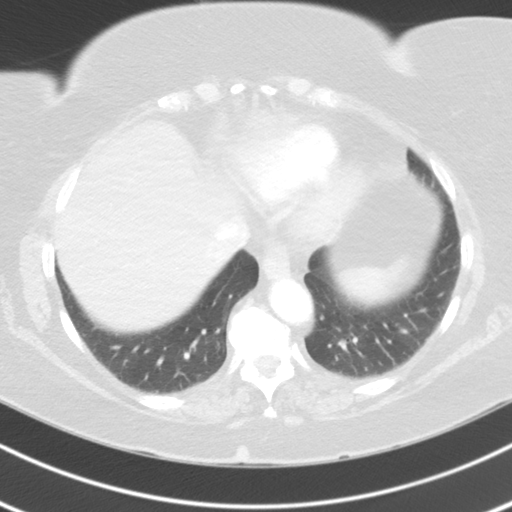
[im 39/91  soft-tissue]
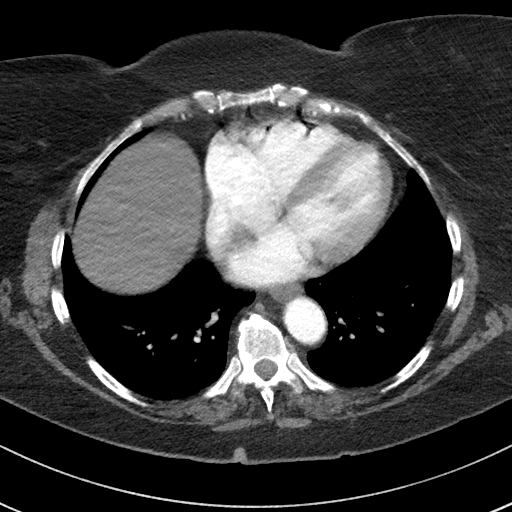
[im 46/91  lung]
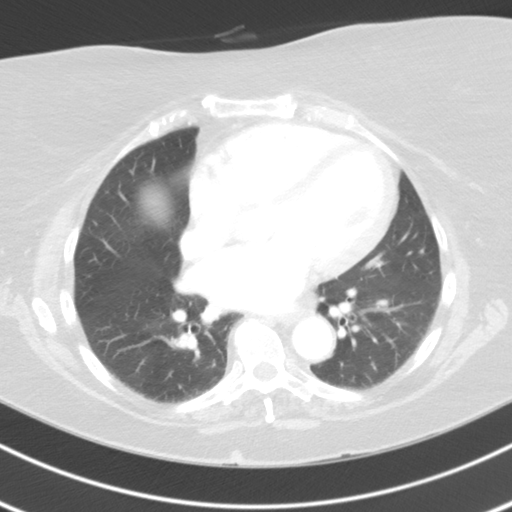
[im 52/91  soft-tissue]
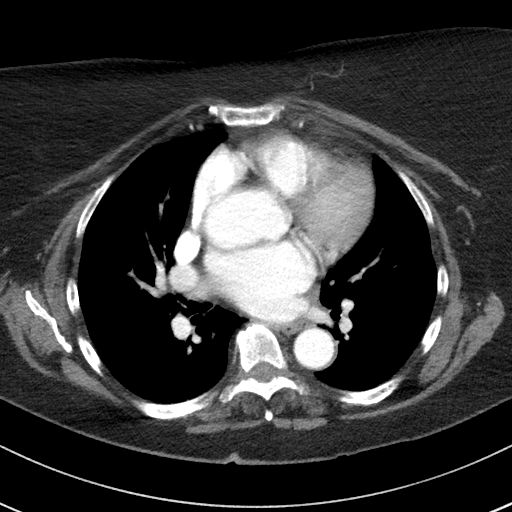
[im 58/91  lung]
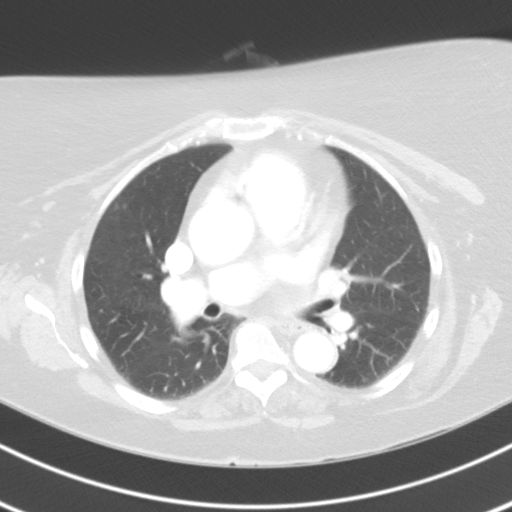
[im 65/91  soft-tissue]
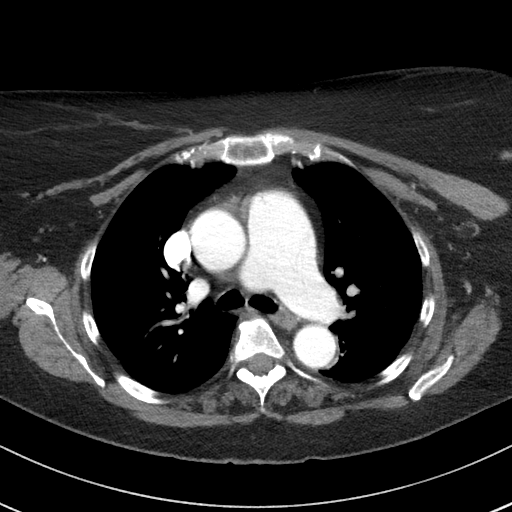
[im 71/91  lung]
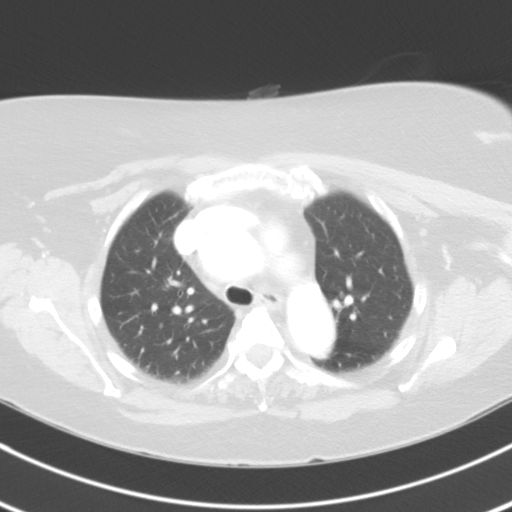
[im 78/91  soft-tissue]
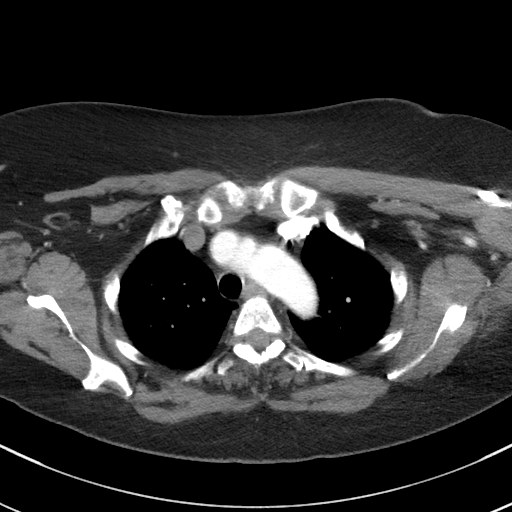
[im 84/91  lung]
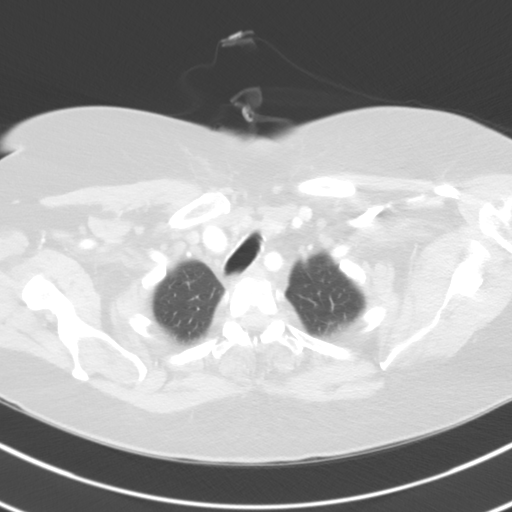

[Series 5: lung · axial · 0.65mm/px · z∈[-210,-170]mm · 2 of 91 slices shown]
[im 7/91  soft-tissue]
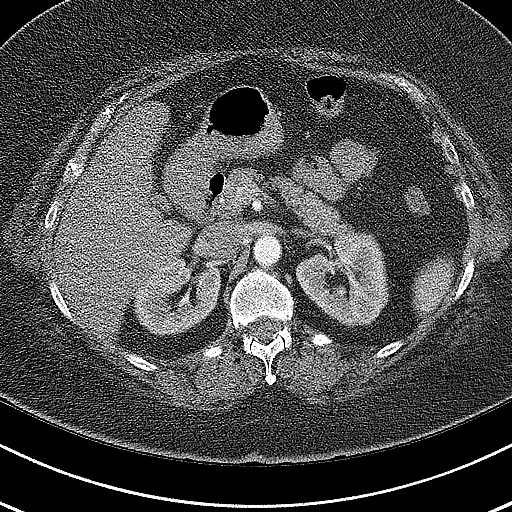
[im 20/91  soft-tissue]
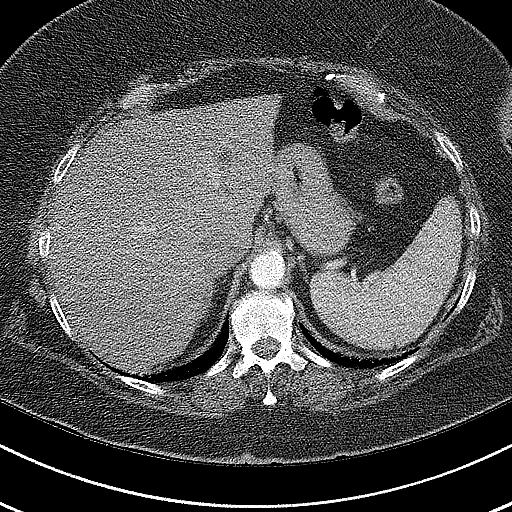

[Series 7: coronals · coronal · 0.56mm/px · 3 of 140 slices shown]
[im 35/140  soft-tissue]
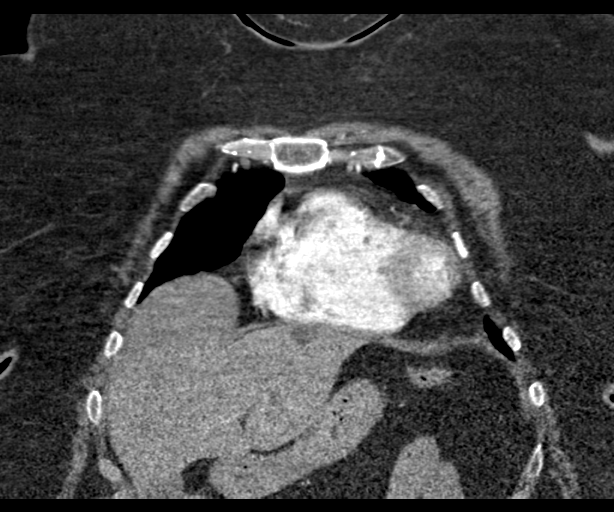
[im 70/140  soft-tissue]
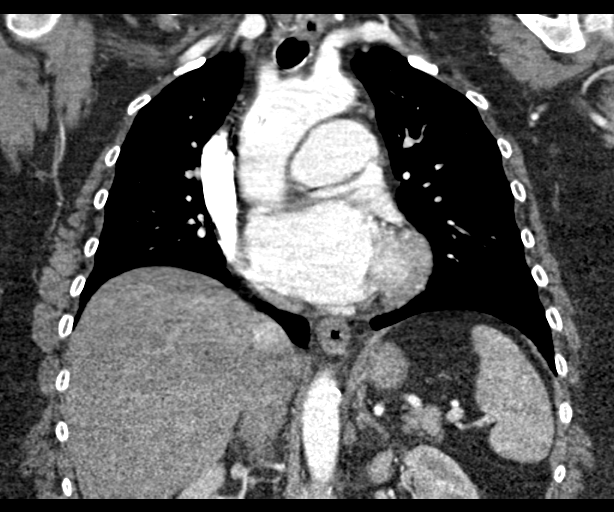
[im 105/140  soft-tissue]
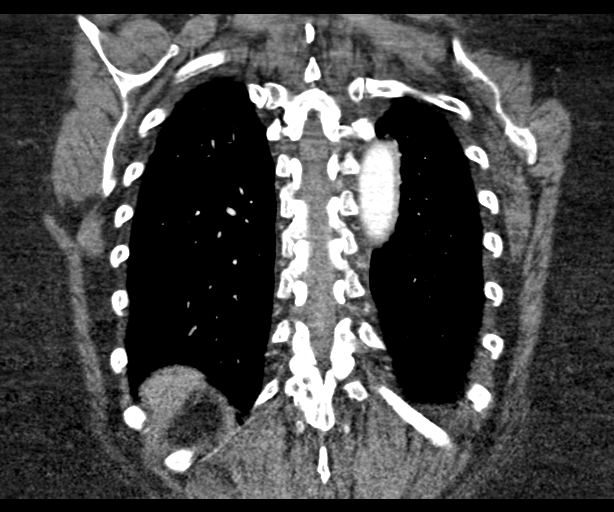

[18 of 46 positions shown; findings below may reference images not displayed]

FINDINGS: Cardiovascular: There is mild cardiac enlargement. Mild aortic
atherosclerosis. The ascending thoracic aorta appears ectatic
measuring up to 3.9 cm, image 61/7. The anterior arch measures
cm. At the level of the posterior arch the aorta measures 2.7 cm.
The proximal descending thoracic aorta measures 2.5 cm. At the level
of the hiatus the aorta measures 2.4 cm.

Mediastinum/Nodes: Low-attenuation nodule within right lobe of
thyroid gland measures 1.1 cm, image [DATE]. Not clinically
significant; no follow-up imaging recommended (ref: [HOSPITAL]. [DATE]): 143-50).

The trachea appears patent and is midline. Small hiatal hernia. No
enlarged mediastinal or hilar lymph nodes.

Lungs/Pleura: No pleural effusion, airspace consolidation or
atelectasis no suspicious pulmonary nodule or mass identified.

Upper Abdomen: 1.8 cm cysts noted within the dome of left lobe of
liver. There is a focal area of low attenuation within the left lobe
of liver adjacent to the falciform ligament measures 1.3 cm and is
favored to represent an area of focal fat. No acute abnormality
noted within the upper abdomen.

Musculoskeletal: No chest wall abnormality. No acute or significant
osseous findings.

Review of the MIP images confirms the above findings.
IMPRESSION: 1. Mildly ectatic ascending thoracic aorta measures 3.9 cm. The
anterior arch measures 3.2 cm. Recommend annual imaging followup by
CTA or MRA. This recommendation follows 5212
ACCF/AHA/AATS/ACR/ASA/SCA/TOMEKK/JEAN-PAUL/GIORGI/RIVERO HANO Guidelines for the
Diagnosis and Management of Patients with Thoracic Aortic Disease.
Circulation.5212; 121: E266-e369. Aortic aneurysm NOS (BX6OE-GFP.6)
2.  Aortic Atherosclerosis (BX6OE-DV0.0).
3. Small hiatal hernia.

## 2021-09-22 NOTE — Progress Notes (Signed)
Cardiology Office Note:    Date:  09/24/2021   ID:  Brittany Villegas, DOB 30-Oct-1950, MRN [id number]  PCP:  London Pepper, MD  Cardiologist:  Donato Heinz, MD  Electrophysiologist:  None   Referring MD: London Pepper, MD   Chief Complaint  Patient presents with   Follow-up    12 months.    History of Present Illness:    Brittany Villegas is a 71 y.o. female with a hx of prediabetes, hyperlipidemia, morbid obesity depression who presents for follow-up.  She was referred by Dr. Orland Mustard for evaluation of rapid heart rate, initially seen on 12/29/2019.  Was seen by Dr. Orland Mustard on 3/18 and started on metoprolol.  She reports episodes of tachycardia for several years. Reports has been occurring since 2012. Has had 2 episodes this year. During episodes feels like heart is racing. Lasts for hours. Check pulse during last episode in was 173. Does not feel irregular. Denies any lightheadedness, syncope, or chest pain. She does report she has been having some dyspnea with exertion. Since starting metoprolol she feels very fatigued and has not been exercising. Her HR has been down to 40s.  No smoking history. Mother had mitral valve replacement at age 52, atrial fibrillation, pacemaker. Father had atrial fibrillation in 80s, pacemaker. She reports she drinks 1 cup of coffee per day. No alcohol intake.Labs from 3/18 showed normal renal function, electrolytes, TSH, blood counts.  A1c 6.0.  TTE on 01/19/2020 showed LVEF 60 to 65%, normal RV function, mild mitral regurgitation, mild ascending aortic dilatation measuring 39 mm.  CTA chest on 02/12/2020 showed mildly ectatic ascending thoracic aorta measuring 39 mm.  Cardiac monitor on 02/07/2020 showed multiple short episodes of SVT, as well as a 3-hour episode of what was recorded as atrial fibrillation, but appears more likely SVT.  She was unable to tolerate low-dose metoprolol due to baseline bradycardia.  Referred to EP and underwent successful SVT ablation on  04/29/2020.  Since last clinic visit, she reports that she has been doing well.  Reports rare palpitations, typically only last for few seconds.  She denies any chest pain, dyspnea, or lower extremity edema.  She had lightheadedness a few months ago, no syncope.  She started exercising by doing YouTube videos for 15 to 20 minutes.  Past Medical History:  Diagnosis Date   Depression     Past Surgical History:  Procedure Laterality Date   DILATION AND CURETTAGE OF UTERUS     HOT HEMOSTASIS  01/13/2012   Procedure: HOT HEMOSTASIS (ARGON PLASMA COAGULATION/BICAP);  Surgeon: Arta Silence, MD;  Location: [facility];  Service: Endoscopy;  Laterality: N/A;   SVT ABLATION N/A 04/29/2020   Procedure: SVT ABLATION;  Surgeon: Evans Lance, MD;  Location: [facility];  Service: Cardiovascular;  Laterality: N/A;    Current Medications: Current Meds  Medication Sig   atorvastatin (LIPITOR) 10 MG tablet Take 5 mg by mouth daily.   metroNIDAZOLE (METROCREAM) 0.75 % cream Apply 1 application topically 2 (two) times daily.   Multiple Vitamin (MULTIVITAMIN WITH MINERALS) TABS tablet Take 1 tablet by mouth daily.   sertraline (ZOLOFT) 100 MG tablet Take 100 mg by mouth daily.   [DISCONTINUED] amLODipine (NORVASC) 10 MG tablet TAKE 1 TABLET(10 MG) BY MOUTH DAILY     Allergies:   Patient has no known allergies.   Social History   Socioeconomic History   Marital status: Single    Spouse name: Not on file   Number of children: Not  on file   Years of education: Not on file   Highest education level: Not on file  Occupational History   Not on file  Tobacco Use   Smoking status: Never   Smokeless tobacco: Never  Vaping Use   Vaping Use: Never used  Substance and Sexual Activity   Alcohol use: Yes   Drug use: No   Sexual activity: Not on file  Other Topics Concern   Not on file  Social History Narrative   Not on file   Social Determinants of Health   Financial Resource Strain: Not  on file  Food Insecurity: Not on file  Transportation Needs: Not on file  Physical Activity: Not on file  Stress: Not on file  Social Connections: Not on file     Family History: The patient's family history is not on file.  ROS:   Please see the history of present illness.     All other systems reviewed and are negative.  EKGs/Labs/Other Studies Reviewed:    The following studies were reviewed today:   EKG:  EKG is  not ordered today.  The ekg ordered at prior clinic visit demonstrates sinus rhythm, rate 80, left axis deviation, nonspecific T wave flattening, poor R wave progression   Recent Labs: 02/27/2021: BUN 18; Creatinine, Ser 0.75; Potassium 4.8; Sodium 143  Recent Lipid Panel No results found for: CHOL, TRIG, HDL, CHOLHDL, VLDL, LDLCALC, LDLDIRECT  Physical Exam:    VS:  BP 132/78 (BP Location: Left Arm, Patient Position: Sitting, Cuff Size: Large)    Pulse 90    Ht 5\' 3"  (1.6 m)    Wt 250 lb (113.4 kg)    BMI 44.29 kg/m     Wt Readings from Last 3 Encounters:  09/24/21 250 lb (113.4 kg)  09/23/21 254 lb 9.6 oz (115.5 kg)  09/08/20 240 lb (108.9 kg)     GEN:  in no acute distress HEENT: Normal NECK: No JVD; No carotid bruits CARDIAC: RRR, no murmurs, rubs, gallops RESPIRATORY:  Clear to auscultation without rales, wheezing or rhonchi  ABDOMEN: Soft, non-tender, non-distended MUSCULOSKELETAL:  No edema; No deformity  SKIN: Warm and dry NEUROLOGIC:  Alert and oriented x 3 PSYCHIATRIC:  Normal affect   ASSESSMENT:    1. Hyperlipidemia, unspecified hyperlipidemia type   2. SVT (supraventricular tachycardia) ([place])   3. Essential hypertension   4. Ascending aorta dilatation ([facility])     PLAN:    SVT: Cardiac monitor on 02/07/2020 showed multiple short episodes of SVT, as well as a 3-hour episode of what was recorded as atrial fibrillation, but appears more likely SVT.  She was unable to tolerate low-dose metoprolol due to baseline bradycardia.  Referred to EP  and underwent successful SVT ablation on 04/29/2020. -Follows with Dr Lovena Le in EP  Hypertension: On amlodipine 10 mg daily.  Appears controlled  OSA: Was on CPAP, but was noncompliant and it was returned.  Saw Dr Claiborne Billings yesterday, she is going to retry CPAP.  Dilated thoracic aorta: Ascending aorta measures 39 mm on CTA, MRA on 03/03/2021 showed mildly ectatic thoracic aorta measuring 37 mm.  Hyperlipidemia: LDL 101 on 02/13/2021, on atorvastatin 10 mg daily.  Will check calcium score to guide how aggressive to be lowering cholesterol.  RTC in 1 year   Medication Adjustments/Labs and Tests Ordered: Current medicines are reviewed at length with the patient today.  Concerns regarding medicines are outlined above.  Orders Placed This Encounter  Procedures   CT CARDIAC SCORING (SELF  PAY ONLY)   Meds ordered this encounter  Medications   amLODipine (NORVASC) 10 MG tablet    Sig: Take 1 tablet (10 mg total) by mouth daily.    Dispense:  90 tablet    Refill:  3    Patient Instructions  Medication Instructions:  The current medical regimen is effective;  continue present plan and medications.  *If you need a refill on your cardiac medications before your next appointment, please call your pharmacy*  Testing: CALCIUM SCORE    Follow-Up: At [facility], you and your health needs are our priority.  As part of our continuing mission to provide you with exceptional heart care, we have created designated Provider Care Teams.  These Care Teams include your primary Cardiologist (physician) and Advanced Practice Providers (APPs -  Physician Assistants and Nurse Practitioners) who all work together to provide you with the care you need, when you need it.  We recommend signing up for the patient portal called "MyChart".  Sign up information is provided on this After Visit Summary.  MyChart is used to connect with patients for Virtual Visits (Telemedicine).  Patients are able to view lab/test  results, encounter notes, upcoming appointments, etc.  Non-urgent messages can be sent to your provider as well.   To learn more about what you can do with MyChart, go to [email or web address].    Your next appointment:   12 month(s)  The format for your next appointment:   In Person  Provider:   Donato Heinz, MD       Signed, Donato Heinz, MD  09/24/2021 4:35 PM    [place]

## 2021-09-23 ENCOUNTER — Other Ambulatory Visit: Payer: Self-pay

## 2021-09-23 ENCOUNTER — Ambulatory Visit: Payer: PPO | Admitting: Cardiovascular Disease

## 2021-09-23 ENCOUNTER — Encounter: Payer: Self-pay | Admitting: Cardiovascular Disease

## 2021-09-23 DIAGNOSIS — G4733 Obstructive sleep apnea (adult) (pediatric): Secondary | ICD-10-CM

## 2021-09-23 DIAGNOSIS — E785 Hyperlipidemia, unspecified: Secondary | ICD-10-CM

## 2021-09-23 DIAGNOSIS — R7303 Prediabetes: Secondary | ICD-10-CM

## 2021-09-23 DIAGNOSIS — I471 Supraventricular tachycardia: Secondary | ICD-10-CM | POA: Diagnosis not present

## 2021-09-23 DIAGNOSIS — I10 Essential (primary) hypertension: Secondary | ICD-10-CM

## 2021-09-23 NOTE — Patient Instructions (Addendum)
Medication Instructions:  Discuss this with Dr Gardiner Rhyme at appt-BP  *If you need a refill on your cardiac medications before your next appointment, please call your pharmacy*  Lab Work:   Testing/Procedures:  NONE    NONE  Special Instructions WANDA WILL CALL YOU ABOUT THE CPAP-BIPAP MACHINE ONLINE  Follow-Up: Your next appointment:  [place] In Person with Shelva Majestic, MD   At [facility], you and your health needs are our priority.  As part of our continuing mission to provide you with exceptional heart care, we have created designated Provider Care Teams.  These Care Teams include your primary Cardiologist (physician) and Advanced Practice Providers (APPs -  Physician Assistants and Nurse Practitioners) who all work together to provide you with the care you need, when you need it.

## 2021-09-23 NOTE — Progress Notes (Signed)
Cardiology Office Note    Date:  09/30/2021   ID:  Brittany Villegas, DOB March 08, 1951, MRN [id number]  PCP:  London Pepper, MD  Cardiologist:  Shelva Majestic, MD (sleep); Dr. Oswaldo Milian EP: Dr. Crissie Sickles  New sleep evaluation  History of Present Illness:  Brittany Villegas is a 71 y.o. female who is followed by Dr. Gardiner Rhyme and has a history of hypertension, hyperlipidemia, morbid obesity, and depression.  She has a history of SVT and underwent SVT ablation by Dr. Lovena Le in August 2021.  Due to concerns for obstructive sleep apnea with snoring, witnessed apnea, gasping for breath during sleep, she was referred by Dr. Gardiner Rhyme for a diagnostic polysomnogram which was done on October 2021.  She was found to have severe overall sleep apnea with an AHI of 36.7/h.  Her symptoms were much more severe during REM sleep with an AHI of 64.8 and with supine sleep at 66.9/h.  There was evidence for loud snoring and her minimum saturation during sleep was 84%.  He subsequently was referred for a CPAP titration study which was done on 04/08/2020.  CPAP was initiated and she was titrated to 11 cm.  However, due to continued events with significant oxygen desaturation to nadir 73%, BiPAP was initiated and she was titrated to 16/12 centimeters of water.  AHI was 0 with O2 nadir 89%.  Unfortunately, due to supply chain issues there was a significant delay in her getting her BiPAP therapy.  [facility] ultimately [facility] from [facility] in August and a download from August 24 through August 11, 2021 showed insufficient compliance with usage at only 14 of 90 days and average usage at 3 hours and 39 minutes.  She was using a fullface mask.  Her pressure settings was a minimum EPAP of 12 with maximum IPAP of 25.  95th percentile pressure 16.5/12.5.  AHI was 2.3.  She stated that she had difficulty with her mask creating her jaw to drop.  However she had a full facemask.  Appears that her machine was picked up on  July 31, 2021 due to very poor compliance.  Presently, he typically goes to bed between 10 and 11 PM and wakes up between 6 and 7 AM.  He admits to snoring.  She denies restless legs or bruxism.  Presently she denies any significant daytime sleepiness and an Epworth Sleepiness Scale score was calculated in the office today and this endorsed at 4.  She presents for her initial evaluation.   Past Medical History:  Diagnosis Date   Depression     Past Surgical History:  Procedure Laterality Date   DILATION AND CURETTAGE OF UTERUS     HOT HEMOSTASIS  01/13/2012   Procedure: HOT HEMOSTASIS (ARGON PLASMA COAGULATION/BICAP);  Surgeon: Arta Silence, MD;  Location: [facility];  Service: Endoscopy;  Laterality: N/A;   SVT ABLATION N/A 04/29/2020   Procedure: SVT ABLATION;  Surgeon: Evans Lance, MD;  Location: [facility];  Service: Cardiovascular;  Laterality: N/A;    Current Medications: Outpatient Medications Prior to Visit  Medication Sig Dispense Refill   atorvastatin (LIPITOR) 10 MG tablet Take 5 mg by mouth daily.     metroNIDAZOLE (METROCREAM) 0.75 % cream Apply 1 application topically 2 (two) times daily.     Multiple Vitamin (MULTIVITAMIN WITH MINERALS) TABS tablet Take 1 tablet by mouth daily.     sertraline (ZOLOFT) 100 MG tablet Take 100 mg by mouth daily.     amLODipine (NORVASC) 10  MG tablet TAKE 1 TABLET(10 MG) BY MOUTH DAILY 90 tablet 0   ketotifen (ZADITOR) 0.025 % ophthalmic solution Place 1 drop into both eyes 2 (two) times daily as needed (dry eyes).     No facility-administered medications prior to visit.     Allergies:   Patient has no known allergies.   Social History   Socioeconomic History   Marital status: Single    Spouse name: Not on file   Number of children: Not on file   Years of education: Not on file   Highest education level: Not on file  Occupational History   Not on file  Tobacco Use   Smoking status: Never   Smokeless tobacco:  Never  Vaping Use   Vaping Use: Never used  Substance and Sexual Activity   Alcohol use: Yes   Drug use: No   Sexual activity: Not on file  Other Topics Concern   Not on file  Social History Narrative   Not on file   Social Determinants of Health   Financial Resource Strain: Not on file  Food Insecurity: Not on file  Transportation Needs: Not on file  Physical Activity: Not on file  Stress: Not on file  Social Connections: Not on file     Family History:  The patient's family history is not on file.   ROS General: Negative; No fevers, chills, or night sweats;  HEENT: Negative; No changes in vision or hearing, sinus congestion, difficulty swallowing Pulmonary: Negative; No cough, wheezing, shortness of breath, hemoptysis Cardiovascular: Negative; No chest pain, presyncope, syncope, palpitations GI: Negative; No nausea, vomiting, diarrhea, or abdominal pain GU: Negative; No dysuria, hematuria, or difficulty voiding Musculoskeletal: Negative; no myalgias, joint pain, or weakness Hematologic/Oncology: Negative; no easy bruising, bleeding Endocrine: Negative; no heat/cold intolerance; no diabetes Neuro: Negative; no changes in balance, headaches Skin: Negative; No rashes or skin lesions Psychiatric: Negative; No behavioral problems, depression Sleep: Negative; No snoring, daytime sleepiness, hypersomnolence, bruxism, restless legs, hypnogognic hallucinations, no cataplexy Other comprehensive 14 point system review is negative.   PHYSICAL EXAM:   VS:  BP (!) 145/85    Pulse 75    Ht $R'5\' 3"'LK$  (1.6 m)    Wt 254 lb 9.6 oz (115.5 kg)    SpO2 97%    BMI 45.10 kg/m     Repeat blood pressure by me was elevated at 150/85.  Wt Readings from Last 3 Encounters:  09/24/21 250 lb (113.4 kg)  09/23/21 254 lb 9.6 oz (115.5 kg)  09/08/20 240 lb (108.9 kg)    General: Alert, oriented, no distress.  Morbid obesity Skin: normal turgor, no rashes, warm and dry HEENT: Normocephalic,  atraumatic. Pupils equal round and reactive to light; sclera anicteric; extraocular muscles intact;  Nose without nasal septal hypertrophy Mouth/Parynx benign; Mallinpatti scale 3/4 Neck: No JVD, no carotid bruits; normal carotid upstroke Lungs: clear to ausculatation and percussion; no wheezing or rales Chest wall: without tenderness to palpitation Heart: PMI not displaced, RRR, s1 s2 normal, 1/6 systolic murmur, no diastolic murmur, no rubs, gallops, thrills, or heaves Abdomen: soft, nontender; no hepatosplenomehaly, BS+; abdominal aorta nontender and not dilated by palpation. Back: no CVA tenderness Pulses 2+ Musculoskeletal: full range of motion, normal strength, no joint deformities Extremities: no clubbing cyanosis or edema, Homan's sign negative  Neurologic: grossly nonfocal; Cranial nerves grossly wnl Psychologic: Normal mood and affect   Studies/Labs Reviewed:   I recently reviewed the ECG of May 20, 2020 which showed normal sinus rhythm at  80 bpm.  Borderline LVH.  Recent Labs: BMP Latest Ref Rng & Units 02/27/2021 04/24/2020 02/01/2020  Glucose 65 - 99 mg/dL 86 102(H) 139(H)  BUN 8 - 27 mg/dL _0 Creatinine 0.57 - 1.00 mg/dL 0.75 0.85 0.90  BUN/Creat Ratio 12 - _1 Sodium 134 - 144 mmol/L 143 140 143  Potassium 3.5 - 5.2 mmol/L 4.8 4.8 4.9  Chloride 96 - 106 mmol/L 99 98 100  CO2 20 - 29 mmol/L _2 Calcium 8.7 - 10.3 mg/dL 9.3 9.3 9.8     No flowsheet data found.  CBC Latest Ref Rng & Units 04/24/2020  WBC 3.4 - 10.8 x10E3/uL 8.2  Hemoglobin 11.1 - 15.9 g/dL 14.2  Hematocrit 34.0 - 46.6 % 42.6  Platelets 150 - 450 x10E3/uL 281   Lab Results  Component Value Date   MCV 89 04/24/2020   No results found for: TSH No results found for: HGBA1C   BNP No results found for: BNP  ProBNP No results found for: PROBNP   Lipid Panel  No results found for: CHOL, TRIG, HDL, CHOLHDL, VLDL, LDLCALC, LDLDIRECT, LABVLDL   RADIOLOGY: No results  found.   Additional studies/ records that were reviewed today include:     Patient Name: Zineb, Glade Date: 06/28/2020 Gender: Female D.O.B: 1951/01/17 Age (years): 68 Referring Provider: Oswaldo Milian Height (inches): 63 Interpreting Physician: Shelva Majestic MD, ABSM Weight (lbs): 240 RPSGT: Baxter Flattery BMI: 43 MRN: [id number] Neck Size: 16.00   CLINICAL INFORMATION Sleep Study Type: NPSG   Indication for sleep study: Obesity, Snoring, Witnesses Apnea / Gasping During Sleep   Epworth Sleepiness Score: 4   SLEEP STUDY TECHNIQUE As per the AASM Manual for the Scoring of Sleep and Associated Events v2.3 (April 2016) with a hypopnea requiring 4% desaturations.   The channels recorded and monitored were frontal, central and occipital EEG, electrooculogram (EOG), submentalis EMG (chin), nasal and oral airflow, thoracic and abdominal wall motion, anterior tibialis EMG, snore microphone, electrocardiogram, and pulse oximetry.   MEDICATIONS amLODipine (NORVASC) 10 MG tablet atorvastatin (LIPITOR) 10 MG tablet ketotifen (ALAWAY) 0.025 % ophthalmic solution metroNIDAZOLE (METROCREAM) 0.75 % cream Multiple Vitamin (MULTIVITAMIN WITH MINERALS) TABS tablet sertraline (ZOLOFT) 100 MG tablet Medications self-administered by patient taken the night of the study : N/A   SLEEP ARCHITECTURE The study was initiated at 10:46:57 PM and ended at 4:46:32 AM.   Sleep onset time was 15.8 minutes and the sleep efficiency was 82.7%%. The total sleep time was 297.5 minutes.   Stage REM latency was 255.0 minutes.   The patient spent 8.4%% of the night in stage N1 sleep, 81.0%% in stage N2 sleep, 0.0%% in stage N3 and 10.6% in REM.   Alpha intrusion was absent.   Supine sleep was 41.01%.   RESPIRATORY PARAMETERS The overall apnea/hypopnea index (AHI) was 36.7 per hour. The respiratory disturbance index (RDI) was 37.1/h. There were 54 total apneas, including 54 obstructive, 0  central and 0 mixed apneas. There were 128 hypopneas and 2 RERAs.   The AHI during Stage REM sleep was 64.8 per hour.   AHI while supine was 66.9 per hour.   The mean oxygen saturation was 92.4%. The minimum SpO2 during sleep was 84.0%.   Loud snoring was noted during this study.   CARDIAC DATA The 2 lead EKG demonstrated sinus rhythm. The mean heart rate was 47.2 beats per minute. Other EKG findings include: PACs, PVCs.   LEG MOVEMENT DATA  The total PLMS were 0 with a resulting PLMS index of 0.0. Associated arousal with leg movement index was 0.0 .   IMPRESSIONS - Severe obstructive sleep apnea occurred during this study (AHI 36.7/h); events were more severe duirng REM sleep (AHI 64.8/h) and with supine sleep (AHI 66.9/h) - No significant central sleep apnea occurred during this study (CAI = 0.0/h). - Moderate oxygen desaturation was noted during this study (Min O2 = 84.0%). - The patient snored with loud snoring volume. - EKG findings include PACs, PVCs. - Clinically significant periodic limb movements did not occur during sleep. No significant associated arousals.   DIAGNOSIS - Obstructive Sleep Apnea (G47.33) - Nocturnal Hypoxemia (G47.36)   RECOMMENDATIONS - Therapeutic CPAP titration to determine optimal pressure required to alleviate sleep disordered breathing. - Effort should be made to optimize nasal and oropharyngeal patency. - Positional therapy avoiding supine position during sleep. - Avoid alcohol, sedatives and other CNS depressants that may worsen sleep apnea and disrupt normal sleep architecture. - Sleep hygiene should be reviewed to assess factors that may improve sleep quality. - Weight management (BMI 43) and regular exercise should be initiated or continued if appropriate.    09/08/2020 CLINICAL INFORMATION The patient is referred for a split night study with BPAP. Most recent polysomnogram dated 06/28/2020:  AHI of 36.7/h;  RDI of 37.1/h; REM AHI 64.8/h;  supine AHI 66.9/h; O2 nadir 84%.    MEDICATIONS amLODipine (NORVASC) 10 MG tablet atorvastatin (LIPITOR) 10 MG tablet ketotifen (ALAWAY) 0.025 % ophthalmic solution metroNIDAZOLE (METROCREAM) 0.75 % cream Multiple Vitamin (MULTIVITAMIN WITH MINERALS) TABS tablet sertraline (ZOLOFT) 100 MG tablet Medications self-administered by patient taken the night of the study : N/A   SLEEP STUDY TECHNIQUE As per the AASM Manual for the Scoring of Sleep and Associated Events v2.3 (April 2016) with a hypopnea requiring 4% desaturations.   The channels recorded and monitored were frontal, central and occipital EEG, electrooculogram (EOG), submentalis EMG (chin), nasal and oral airflow, thoracic and abdominal wall motion, anterior tibialis EMG, snore microphone, electrocardiogram, and pulse oximetry. Bi-level positive airway pressure (BiPAP) was initiated when the patient met split night criteria and was titrated according to treat sleep-disordered breathing.   RESPIRATORY PARAMETERS Diagnostic Total AHI (/hr):            20.4     RDI (/hr):         20.8     OA Index (/hr):            0.9       CA Index (/hr):      0.0 REM AHI (/hr):            0.0       NREM AHI (/hr):          20.9     Supine AHI (/hr):         21.1     Non-supine AHI (/hr):        11.61 Min O2 Sat (%):          73.0     Mean O2 (%):  90.5     Time below 88% (min):           42.8        Titration Optimal IPAP Pressure (cm): 16        Optimal EPAP Pressure (cm):            12        AHI at Optimal Pressure (/hr):  0.0       Min O2 at Optimal Pressure (%):       89.0 Sleep % at Optimal (%):         100      Supine % at Optimal (%):       100           SLEEP ARCHITECTURE The study was initiated at 10:08:53 PM and terminated at 5:09:00 AM. The total recorded time was 420.1 minutes. EEG confirmed total sleep time was 276.8 minutes yielding a sleep efficiency of 65.9%%. Sleep onset after lights out was 9.9 minutes with a REM latency  of 400.5 minutes. The patient spent 17.7%% of the night in stage N1 sleep, 79.6%% in stage N2 sleep, 0.0%% in stage N3 and 2.7% in REM. Wake after sleep onset (WASO) was 133.5 minutes. The Arousal Index was 15.2/hour.   LEG MOVEMENT DATA The total Periodic Limb Movements of Sleep (PLMS) were 0. The PLMS index was 0.0 .   CARDIAC DATA The 2 lead EKG demonstrated sinus rhythm. The mean heart rate was 100.0 beats per minute. Other EKG findings include: PVCs.   IMPRESSIONS - CPAP was initiated and was titrated to 11 cm. Due to continued events with significant oxygen desataturation to a nadir of 73%, BIPAP was initiated and titrated to  BiPAP at 16/12 cm of water; AHI 0, O2 nadir 89%. - No significant central sleep apnea occurred during the diagnostic portion of the study (CAI = 0.0/hour). - No snoring was audible during this study. - EKG findings include Ventricular Tachycardia, PVCs. - Clinically significant periodic limb movements of sleep did not occur during the study.   DIAGNOSIS - Obstructive Sleep Apnea (G47.33)   RECOMMENDATIONS - With the previously documented severity in REM sleep and positional component recommend an initial trial of BiPAP Auto therapy with EPAP min of 12, PS of 4 and IPAP max of 25 cm H2O with heated humidification. A Large size Resmed Full Face Mask AirFit F20 mask was used for the titration. - Effort should be made to optimize nasal and oropharyngeal patency. - Avoid alcohol, sedatives and other CNS depressants that may worsen sleep apnea and disrupt normal sleep architecture. - Sleep hygiene should be reviewed to assess factors that may improve sleep quality. - Weight management and regular exercise should be initiated or continued. - Recommend a download in 30 days and sleep clinic evaluation after one month of therapy.   ASSESSMENT:    1. Severe obstructive sleep apnea   2. SVT (supraventricular tachycardia) [facility]): Status post ablation Dr. Lovena Le   3.  Essential hypertension   4. Hyperlipidemia, unspecified hyperlipidemia type   5. Morbid obesity ([place])   6. Prediabetes     PLAN:  Ms. Brittany Villegas is a 71 year old female who has a history of SVT, status post successful ablation, as well as a history of hypertension, prediabetes, hyperlipidemia, and depression.  Due to symptoms of snoring, awakening gasping for breath, nonrestorative sleep, she was referred for a sleep study in October 2021.  She was found to have severe obstructive sleep apnea with more severe events during REM sleep and with supine position.  On CPAP titration evaluation, she ultimately required transition to BiPAP therapy and was titrated up to a pressure of 12/16.  AHI was 0 with O2 nadir 89%.  It was recommended that she initiate therapy with BiPAP auto with an EPAP min of 12, pressure support of 4, and IPAP maximum of 25 cm with heated  humidification.  A large ResMed fullface mask F 20 was used for the titration.  Unfortunately, due to supply chain issues, she did not receive a BiPAP machine until August 2022.  Unfortunately, she stated that her jaw was dropping with the mask, and as result her compliance was minimal.  Throughout the first several months of treatment, she only used the device for 14 days and ultimately her device was picked up by [facility] on July 31, 2021 due to noncompliance.  I had a lengthy discussion with Ms. Khalid in the office today.  I reviewed her sleep study and the severity of her sleep disordered breathing.  I had a lengthy discussion regarding normal sleep architecture and the adverse consequences of untreated sleep apnea causing disruptive sleep architecture.  In addition I discussed increased arousals contributing to increased sympathetic tone and as result contribution to elevated blood pressure, nocturnal arrhythmias, increase incidence of atrial fibrillation, as well as significant nocturnal hypoxemia contributing to potential ischemia.   I discussed its effects on inflammation, insulin resistance with increased glucose, and GERD.  After much discussion, she believes she would like to retry therapy.  She is aware that her machine was picked up due to lack of compliance.  However she is willing to pay out-of-pocket for therapy.  As result I have given her information regarding online purchase of BiPAP therapy.  Barry Brunner, our sleep coordinator will provide her with additional information and we will prescribe her BiPAP advice and parameters.  Her blood pressure in the office today was elevated.  She has a follow-up appointment with Dr. Gardiner Rhyme and blood pressure medication most likely will need to be implemented.  We discussed the importance of weight loss and proper diet as well as exercise.  I will see her in follow-up once she had obtained a new BiPAP machine and will hopefully be able to work with her to ensure compliance and optimal treatment.  Time spent: 45 minutes  Medication Adjustments/Labs and Tests Ordered: Current medicines are reviewed at length with the patient today.  Concerns regarding medicines are outlined above.  Medication changes, Labs and Tests ordered today are listed in the Patient Instructions below. Patient Instructions  Medication Instructions:  Discuss this with Dr Gardiner Rhyme at appt-BP  *If you need a refill on your cardiac medications before your next appointment, please call your pharmacy*  Lab Work:   Testing/Procedures:  NONE    NONE  Special Instructions WANDA WILL CALL YOU ABOUT THE CPAP-BIPAP MACHINE ONLINE  Follow-Up: Your next appointment:  [place] In Person with Shelva Majestic, MD   At [facility], you and your health needs are our priority.  As part of our continuing mission to provide you with exceptional heart care, we have created designated Provider Care Teams.  These Care Teams include your primary Cardiologist (physician) and Advanced Practice Providers (APPs -   Physician Assistants and Nurse Practitioners) who all work together to provide you with the care you need, when you need it.     Signed, Shelva Majestic, MD  09/30/2021 11:30 AM    [place] [address] [place], [place], [place]  [address] Phone: [phone]

## 2021-09-23 NOTE — Progress Notes (Incomplete)
Cardiology Office Note    Date:  09/23/2021   ID:  Brittany Villegas, DOB 1951/02/05, MRN [id number]  PCP:  London Pepper, MD  Cardiologist:  Shelva Majestic, MD   No chief complaint on file.   History of Present Illness:  Brittany Villegas is a 71 y.o. female ***    Past Medical History:  Diagnosis Date   Depression     Past Surgical History:  Procedure Laterality Date   DILATION AND CURETTAGE OF UTERUS     HOT HEMOSTASIS  01/13/2012   Procedure: HOT HEMOSTASIS (ARGON PLASMA COAGULATION/BICAP);  Surgeon: Arta Silence, MD;  Location: [facility];  Service: Endoscopy;  Laterality: N/A;   SVT ABLATION N/A 04/29/2020   Procedure: SVT ABLATION;  Surgeon: Evans Lance, MD;  Location: [facility];  Service: Cardiovascular;  Laterality: N/A;    Current Medications: Outpatient Medications Prior to Visit  Medication Sig Dispense Refill   amLODipine (NORVASC) 10 MG tablet TAKE 1 TABLET(10 MG) BY MOUTH DAILY 90 tablet 0   atorvastatin (LIPITOR) 10 MG tablet Take 5 mg by mouth daily.     metroNIDAZOLE (METROCREAM) 0.75 % cream Apply 1 application topically 2 (two) times daily.     Multiple Vitamin (MULTIVITAMIN WITH MINERALS) TABS tablet Take 1 tablet by mouth daily.     sertraline (ZOLOFT) 100 MG tablet Take 100 mg by mouth daily.     ketotifen (ZADITOR) 0.025 % ophthalmic solution Place 1 drop into both eyes 2 (two) times daily as needed (dry eyes).     No facility-administered medications prior to visit.     Allergies:   Patient has no known allergies.   Social History   Socioeconomic History   Marital status: Single    Spouse name: Not on file   Number of children: Not on file   Years of education: Not on file   Highest education level: Not on file  Occupational History   Not on file  Tobacco Use   Smoking status: Never   Smokeless tobacco: Never  Vaping Use   Vaping Use: Never used  Substance and Sexual Activity   Alcohol use: Yes   Drug use: No   Sexual  activity: Not on file  Other Topics Concern   Not on file  Social History Narrative   Not on file   Social Determinants of Health   Financial Resource Strain: Not on file  Food Insecurity: Not on file  Transportation Needs: Not on file  Physical Activity: Not on file  Stress: Not on file  Social Connections: Not on file     Family History:  The patient's ***family history is not on file.   ROS General: Negative; No fevers, chills, or night sweats;  HEENT: Negative; No changes in vision or hearing, sinus congestion, difficulty swallowing Pulmonary: Negative; No cough, wheezing, shortness of breath, hemoptysis Cardiovascular: Negative; No chest pain, presyncope, syncope, palpitations GI: Negative; No nausea, vomiting, diarrhea, or abdominal pain GU: Negative; No dysuria, hematuria, or difficulty voiding Musculoskeletal: Negative; no myalgias, joint pain, or weakness Hematologic/Oncology: Negative; no easy bruising, bleeding Endocrine: Negative; no heat/cold intolerance; no diabetes Neuro: Negative; no changes in balance, headaches Skin: Negative; No rashes or skin lesions Psychiatric: Negative; No behavioral problems, depression Sleep: Negative; No snoring, daytime sleepiness, hypersomnolence, bruxism, restless legs, hypnogognic hallucinations, no cataplexy Other comprehensive 14 point system review is negative.   PHYSICAL EXAM:   VS:  BP (!) 145/85    Pulse 75    Ht 5'  3" (1.6 m)    Wt 254 lb 9.6 oz (115.5 kg)    SpO2 97%    BMI 45.10 kg/m    Wt Readings from Last 3 Encounters:  09/23/21 254 lb 9.6 oz (115.5 kg)  09/08/20 240 lb (108.9 kg)  06/28/20 240 lb (108.9 kg)    General: Alert, oriented, no distress.  Skin: normal turgor, no rashes, warm and dry HEENT: Normocephalic, atraumatic. Pupils equal round and reactive to light; sclera anicteric; extraocular muscles intact; Fundi ** Nose without nasal septal hypertrophy Mouth/Parynx benign; Mallinpatti scale Neck: No  JVD, no carotid bruits; normal carotid upstroke Lungs: clear to ausculatation and percussion; no wheezing or rales Chest wall: without tenderness to palpitation Heart: PMI not displaced, RRR, s1 s2 normal, 1/6 systolic murmur, no diastolic murmur, no rubs, gallops, thrills, or heaves Abdomen: soft, nontender; no hepatosplenomehaly, BS+; abdominal aorta nontender and not dilated by palpation. Back: no CVA tenderness Pulses 2+ Musculoskeletal: full range of motion, normal strength, no joint deformities Extremities: no clubbing cyanosis or edema, Homan's sign negative  Neurologic: grossly nonfocal; Cranial nerves grossly wnl Psychologic: Normal mood and affect   Studies/Labs Reviewed:   EKG:  EKG is*** ordered today.  The ekg ordered today demonstrates ***  Recent Labs: BMP Latest Ref Rng & Units 02/27/2021 04/24/2020 02/01/2020  Glucose 65 - 99 mg/dL 86 102(H) 139(H)  BUN 8 - 27 mg/dL 18 19 14   Creatinine 0.57 - 1.00 mg/dL 0.75 0.85 0.90  BUN/Creat Ratio 12 - 28 24 22 16   Sodium 134 - 144 mmol/L 143 140 143  Potassium 3.5 - 5.2 mmol/L 4.8 4.8 4.9  Chloride 96 - 106 mmol/L 99 98 100  CO2 20 - 29 mmol/L 26 27 28   Calcium 8.7 - 10.3 mg/dL 9.3 9.3 9.8     No flowsheet data found.  CBC Latest Ref Rng & Units 04/24/2020  WBC 3.4 - 10.8 x10E3/uL 8.2  Hemoglobin 11.1 - 15.9 g/dL 14.2  Hematocrit 34.0 - 46.6 % 42.6  Platelets 150 - 450 x10E3/uL 281   Lab Results  Component Value Date   MCV 89 04/24/2020   No results found for: TSH No results found for: HGBA1C   BNP No results found for: BNP  ProBNP No results found for: PROBNP   Lipid Panel  No results found for: CHOL, TRIG, HDL, CHOLHDL, VLDL, LDLCALC, LDLDIRECT, LABVLDL   RADIOLOGY: No results found.   Additional studies/ records that were reviewed today include:  ***    ASSESSMENT:    No diagnosis found.   PLAN:  ***   Medication Adjustments/Labs and Tests Ordered: Current medicines are reviewed at length  with the patient today.  Concerns regarding medicines are outlined above.  Medication changes, Labs and Tests ordered today are listed in the Patient Instructions below. There are no Patient Instructions on file for this visit.   Signed, Shelva Majestic, MD  09/23/2021 10:56 AM    [facility] [address], [place], [place], [place]  [address] Phone: [phone]

## 2021-09-24 ENCOUNTER — Encounter: Payer: Self-pay | Admitting: Cardiology

## 2021-09-24 ENCOUNTER — Ambulatory Visit: Payer: PPO | Admitting: Cardiology

## 2021-09-24 VITALS — BP 132/78 | HR 90 | Ht 63.0 in | Wt 250.0 lb

## 2021-09-24 DIAGNOSIS — I77810 Thoracic aortic ectasia: Secondary | ICD-10-CM

## 2021-09-24 DIAGNOSIS — E785 Hyperlipidemia, unspecified: Secondary | ICD-10-CM

## 2021-09-24 DIAGNOSIS — I471 Supraventricular tachycardia, unspecified: Secondary | ICD-10-CM

## 2021-09-24 DIAGNOSIS — I10 Essential (primary) hypertension: Secondary | ICD-10-CM

## 2021-09-24 MED ORDER — AMLODIPINE BESYLATE 10 MG PO TABS: 10.0000 mg | ORAL_TABLET | Freq: Every day | ORAL | 3 refills | Status: DC

## 2021-09-24 NOTE — Patient Instructions (Addendum)
Medication Instructions:  The current medical regimen is effective;  continue present plan and medications.  *If you need a refill on your cardiac medications before your next appointment, please call your pharmacy*  Testing: CALCIUM SCORE    Follow-Up: At [facility], you and your health needs are our priority.  As part of our continuing mission to provide you with exceptional heart care, we have created designated Provider Care Teams.  These Care Teams include your primary Cardiologist (physician) and Advanced Practice Providers (APPs -  Physician Assistants and Nurse Practitioners) who all work together to provide you with the care you need, when you need it.  We recommend signing up for the patient portal called "MyChart".  Sign up information is provided on this After Visit Summary.  MyChart is used to connect with patients for Virtual Visits (Telemedicine).  Patients are able to view lab/test results, encounter notes, upcoming appointments, etc.  Non-urgent messages can be sent to your provider as well.   To learn more about what you can do with MyChart, go to [email or web address].    Your next appointment:   12 month(s)  The format for your next appointment:   In Person  Provider:   Donato Heinz, MD

## 2021-09-30 ENCOUNTER — Encounter: Payer: Self-pay | Admitting: Cardiovascular Disease

## 2021-10-22 ENCOUNTER — Ambulatory Visit (INDEPENDENT_AMBULATORY_CARE_PROVIDER_SITE_OTHER)
Admission: RE | Admit: 2021-10-22 | Discharge: 2021-10-22 | Disposition: A | Discharge status: Home / Self Care | Payer: Self-pay | Source: Ambulatory Visit | Attending: Cardiology | Admitting: Cardiology

## 2021-10-22 ENCOUNTER — Other Ambulatory Visit: Payer: Self-pay

## 2021-10-22 DIAGNOSIS — E785 Hyperlipidemia, unspecified: Secondary | ICD-10-CM

## 2021-11-07 ENCOUNTER — Encounter: Payer: Self-pay | Admitting: Cardiovascular Disease

## 2021-11-10 ENCOUNTER — Telehealth: Payer: Self-pay | Admitting: *Deleted

## 2021-11-10 NOTE — Telephone Encounter (Signed)
Returned a call to the patient. Apologized to her that I have not reached out before now. I was not aware that I needed to contact her. After finding out what she needed, I recommended that since she will be paying out of pocket, then why not call [employer] to see if they can sell her back her BIPAP that was picked up by them if they still have it. If not maybe they have another one that was picked up. This will save her some $$. She agrees. I then called Ivin Booty at [employer] and she said that she will contact the patient and work it out with her. I also faxed over Dr Evette Georges January 10th office note.

## 2022-01-06 DIAGNOSIS — Z961 Presence of intraocular lens: Secondary | ICD-10-CM | POA: Diagnosis not present

## 2022-01-06 DIAGNOSIS — H5203 Hypermetropia, bilateral: Secondary | ICD-10-CM | POA: Diagnosis not present

## 2022-01-06 DIAGNOSIS — H524 Presbyopia: Secondary | ICD-10-CM | POA: Diagnosis not present

## 2022-01-06 DIAGNOSIS — H35372 Puckering of macula, left eye: Secondary | ICD-10-CM | POA: Diagnosis not present

## 2022-01-06 DIAGNOSIS — H43812 Vitreous degeneration, left eye: Secondary | ICD-10-CM | POA: Diagnosis not present

## 2022-01-06 DIAGNOSIS — H52223 Regular astigmatism, bilateral: Secondary | ICD-10-CM | POA: Diagnosis not present

## 2022-01-06 DIAGNOSIS — Q141 Congenital malformation of retina: Secondary | ICD-10-CM | POA: Diagnosis not present

## 2022-03-05 DIAGNOSIS — Z5181 Encounter for therapeutic drug level monitoring: Secondary | ICD-10-CM | POA: Diagnosis not present

## 2022-03-05 DIAGNOSIS — Z0000 Encounter for general adult medical examination without abnormal findings: Secondary | ICD-10-CM | POA: Diagnosis not present

## 2022-03-05 DIAGNOSIS — E785 Hyperlipidemia, unspecified: Secondary | ICD-10-CM | POA: Diagnosis not present

## 2022-03-05 DIAGNOSIS — R7303 Prediabetes: Secondary | ICD-10-CM | POA: Diagnosis not present

## 2022-03-06 DIAGNOSIS — E785 Hyperlipidemia, unspecified: Secondary | ICD-10-CM | POA: Diagnosis not present

## 2022-03-06 DIAGNOSIS — R7303 Prediabetes: Secondary | ICD-10-CM | POA: Diagnosis not present

## 2022-03-06 DIAGNOSIS — Z1211 Encounter for screening for malignant neoplasm of colon: Secondary | ICD-10-CM | POA: Diagnosis not present

## 2022-03-06 DIAGNOSIS — I10 Essential (primary) hypertension: Secondary | ICD-10-CM | POA: Diagnosis not present

## 2022-03-06 DIAGNOSIS — E2839 Other primary ovarian failure: Secondary | ICD-10-CM | POA: Diagnosis not present

## 2022-03-06 DIAGNOSIS — F419 Anxiety disorder, unspecified: Secondary | ICD-10-CM | POA: Diagnosis not present

## 2022-03-06 DIAGNOSIS — Z0000 Encounter for general adult medical examination without abnormal findings: Secondary | ICD-10-CM | POA: Diagnosis not present

## 2022-03-06 DIAGNOSIS — G4733 Obstructive sleep apnea (adult) (pediatric): Secondary | ICD-10-CM | POA: Diagnosis not present

## 2022-03-06 DIAGNOSIS — E041 Nontoxic single thyroid nodule: Secondary | ICD-10-CM | POA: Diagnosis not present

## 2022-03-06 DIAGNOSIS — I77810 Thoracic aortic ectasia: Secondary | ICD-10-CM | POA: Diagnosis not present

## 2022-04-06 DIAGNOSIS — R7303 Prediabetes: Secondary | ICD-10-CM | POA: Diagnosis not present

## 2022-04-06 DIAGNOSIS — G4733 Obstructive sleep apnea (adult) (pediatric): Secondary | ICD-10-CM | POA: Diagnosis not present

## 2022-04-06 DIAGNOSIS — E785 Hyperlipidemia, unspecified: Secondary | ICD-10-CM | POA: Diagnosis not present

## 2022-04-17 DIAGNOSIS — G4733 Obstructive sleep apnea (adult) (pediatric): Secondary | ICD-10-CM | POA: Diagnosis not present

## 2022-05-15 ENCOUNTER — Other Ambulatory Visit: Payer: Self-pay | Admitting: Family Medicine

## 2022-05-15 DIAGNOSIS — Z1231 Encounter for screening mammogram for malignant neoplasm of breast: Secondary | ICD-10-CM

## 2022-05-21 DIAGNOSIS — G4733 Obstructive sleep apnea (adult) (pediatric): Secondary | ICD-10-CM | POA: Diagnosis not present

## 2022-06-04 ENCOUNTER — Ambulatory Visit
Admission: RE | Admit: 2022-06-04 | Discharge: 2022-06-04 | Disposition: A | Discharge status: Home / Self Care | Payer: PPO | Source: Ambulatory Visit | Attending: Family Medicine | Admitting: Family Medicine

## 2022-06-04 DIAGNOSIS — Z1231 Encounter for screening mammogram for malignant neoplasm of breast: Secondary | ICD-10-CM

## 2022-06-08 ENCOUNTER — Other Ambulatory Visit: Payer: Self-pay | Admitting: Family Medicine

## 2022-06-08 DIAGNOSIS — R928 Other abnormal and inconclusive findings on diagnostic imaging of breast: Secondary | ICD-10-CM

## 2022-06-20 DIAGNOSIS — G4733 Obstructive sleep apnea (adult) (pediatric): Secondary | ICD-10-CM | POA: Diagnosis not present

## 2022-06-29 ENCOUNTER — Ambulatory Visit
Admission: RE | Admit: 2022-06-29 | Discharge: 2022-06-29 | Disposition: A | Discharge status: Home / Self Care | Payer: PPO | Source: Ambulatory Visit | Attending: Family Medicine | Admitting: Family Medicine

## 2022-06-29 ENCOUNTER — Other Ambulatory Visit: Payer: Self-pay | Admitting: Family Medicine

## 2022-06-29 DIAGNOSIS — R921 Mammographic calcification found on diagnostic imaging of breast: Secondary | ICD-10-CM

## 2022-06-29 DIAGNOSIS — R928 Other abnormal and inconclusive findings on diagnostic imaging of breast: Secondary | ICD-10-CM

## 2022-07-02 DIAGNOSIS — E785 Hyperlipidemia, unspecified: Secondary | ICD-10-CM | POA: Diagnosis not present

## 2022-07-02 DIAGNOSIS — Z1331 Encounter for screening for depression: Secondary | ICD-10-CM | POA: Diagnosis not present

## 2022-07-02 DIAGNOSIS — Z6841 Body Mass Index (BMI) 40.0 and over, adult: Secondary | ICD-10-CM | POA: Diagnosis not present

## 2022-07-02 DIAGNOSIS — R7303 Prediabetes: Secondary | ICD-10-CM | POA: Diagnosis not present

## 2022-07-02 DIAGNOSIS — G4733 Obstructive sleep apnea (adult) (pediatric): Secondary | ICD-10-CM | POA: Diagnosis not present

## 2022-07-07 ENCOUNTER — Ambulatory Visit
Admission: RE | Admit: 2022-07-07 | Discharge: 2022-07-07 | Disposition: A | Discharge status: Home / Self Care | Payer: PPO | Source: Ambulatory Visit | Attending: Family Medicine | Admitting: Family Medicine

## 2022-07-07 DIAGNOSIS — R921 Mammographic calcification found on diagnostic imaging of breast: Secondary | ICD-10-CM

## 2022-07-07 DIAGNOSIS — N6012 Diffuse cystic mastopathy of left breast: Secondary | ICD-10-CM | POA: Diagnosis not present

## 2022-07-07 HISTORY — PX: BREAST BIOPSY: SHX20

## 2022-07-14 ENCOUNTER — Other Ambulatory Visit: Payer: Self-pay | Admitting: Cardiology

## 2022-07-16 DIAGNOSIS — Z6841 Body Mass Index (BMI) 40.0 and over, adult: Secondary | ICD-10-CM | POA: Diagnosis not present

## 2022-07-16 DIAGNOSIS — R7303 Prediabetes: Secondary | ICD-10-CM | POA: Diagnosis not present

## 2022-07-16 DIAGNOSIS — E785 Hyperlipidemia, unspecified: Secondary | ICD-10-CM | POA: Diagnosis not present

## 2022-07-21 DIAGNOSIS — G4733 Obstructive sleep apnea (adult) (pediatric): Secondary | ICD-10-CM | POA: Diagnosis not present

## 2022-08-12 DIAGNOSIS — Z86010 Personal history of colonic polyps: Secondary | ICD-10-CM | POA: Diagnosis not present

## 2022-08-12 DIAGNOSIS — Z09 Encounter for follow-up examination after completed treatment for conditions other than malignant neoplasm: Secondary | ICD-10-CM | POA: Diagnosis not present

## 2022-08-12 DIAGNOSIS — D125 Benign neoplasm of sigmoid colon: Secondary | ICD-10-CM | POA: Diagnosis not present

## 2022-08-17 DIAGNOSIS — E785 Hyperlipidemia, unspecified: Secondary | ICD-10-CM | POA: Diagnosis not present

## 2022-08-17 DIAGNOSIS — R7303 Prediabetes: Secondary | ICD-10-CM | POA: Diagnosis not present

## 2022-08-17 DIAGNOSIS — E65 Localized adiposity: Secondary | ICD-10-CM | POA: Diagnosis not present

## 2022-08-17 DIAGNOSIS — Z6841 Body Mass Index (BMI) 40.0 and over, adult: Secondary | ICD-10-CM | POA: Diagnosis not present

## 2022-08-20 DIAGNOSIS — G4733 Obstructive sleep apnea (adult) (pediatric): Secondary | ICD-10-CM | POA: Diagnosis not present

## 2022-08-26 DIAGNOSIS — I10 Essential (primary) hypertension: Secondary | ICD-10-CM | POA: Diagnosis not present

## 2022-08-26 DIAGNOSIS — G4733 Obstructive sleep apnea (adult) (pediatric): Secondary | ICD-10-CM | POA: Diagnosis not present

## 2022-09-20 DIAGNOSIS — G4733 Obstructive sleep apnea (adult) (pediatric): Secondary | ICD-10-CM | POA: Diagnosis not present

## 2022-10-07 DIAGNOSIS — Z6841 Body Mass Index (BMI) 40.0 and over, adult: Secondary | ICD-10-CM | POA: Diagnosis not present

## 2022-10-07 DIAGNOSIS — G4733 Obstructive sleep apnea (adult) (pediatric): Secondary | ICD-10-CM | POA: Diagnosis not present

## 2022-10-12 ENCOUNTER — Other Ambulatory Visit: Payer: Self-pay | Admitting: Cardiology

## 2022-10-13 DIAGNOSIS — I10 Essential (primary) hypertension: Secondary | ICD-10-CM | POA: Diagnosis not present

## 2022-10-13 DIAGNOSIS — R7303 Prediabetes: Secondary | ICD-10-CM | POA: Diagnosis not present

## 2022-10-13 DIAGNOSIS — Z6841 Body Mass Index (BMI) 40.0 and over, adult: Secondary | ICD-10-CM | POA: Diagnosis not present

## 2022-10-13 DIAGNOSIS — E785 Hyperlipidemia, unspecified: Secondary | ICD-10-CM | POA: Diagnosis not present

## 2022-10-13 DIAGNOSIS — E65 Localized adiposity: Secondary | ICD-10-CM | POA: Diagnosis not present

## 2022-10-21 DIAGNOSIS — G4733 Obstructive sleep apnea (adult) (pediatric): Secondary | ICD-10-CM | POA: Diagnosis not present

## 2022-11-16 DIAGNOSIS — E785 Hyperlipidemia, unspecified: Secondary | ICD-10-CM | POA: Diagnosis not present

## 2022-11-16 DIAGNOSIS — E65 Localized adiposity: Secondary | ICD-10-CM | POA: Diagnosis not present

## 2022-11-16 DIAGNOSIS — R7303 Prediabetes: Secondary | ICD-10-CM | POA: Diagnosis not present

## 2022-11-16 DIAGNOSIS — Z6841 Body Mass Index (BMI) 40.0 and over, adult: Secondary | ICD-10-CM | POA: Diagnosis not present

## 2022-11-16 DIAGNOSIS — I10 Essential (primary) hypertension: Secondary | ICD-10-CM | POA: Diagnosis not present

## 2022-11-19 DIAGNOSIS — G4733 Obstructive sleep apnea (adult) (pediatric): Secondary | ICD-10-CM | POA: Diagnosis not present

## 2022-12-20 DIAGNOSIS — G4733 Obstructive sleep apnea (adult) (pediatric): Secondary | ICD-10-CM | POA: Diagnosis not present

## 2023-01-06 DIAGNOSIS — Z6841 Body Mass Index (BMI) 40.0 and over, adult: Secondary | ICD-10-CM | POA: Diagnosis not present

## 2023-01-06 DIAGNOSIS — E65 Localized adiposity: Secondary | ICD-10-CM | POA: Diagnosis not present

## 2023-01-06 DIAGNOSIS — R7303 Prediabetes: Secondary | ICD-10-CM | POA: Diagnosis not present

## 2023-01-06 DIAGNOSIS — I700 Atherosclerosis of aorta: Secondary | ICD-10-CM | POA: Diagnosis not present

## 2023-01-06 DIAGNOSIS — E785 Hyperlipidemia, unspecified: Secondary | ICD-10-CM | POA: Diagnosis not present

## 2023-01-06 DIAGNOSIS — I10 Essential (primary) hypertension: Secondary | ICD-10-CM | POA: Diagnosis not present

## 2023-01-12 DIAGNOSIS — H35372 Puckering of macula, left eye: Secondary | ICD-10-CM | POA: Diagnosis not present

## 2023-01-12 DIAGNOSIS — H43392 Other vitreous opacities, left eye: Secondary | ICD-10-CM | POA: Diagnosis not present

## 2023-01-12 DIAGNOSIS — Q141 Congenital malformation of retina: Secondary | ICD-10-CM | POA: Diagnosis not present

## 2023-01-12 DIAGNOSIS — H40011 Open angle with borderline findings, low risk, right eye: Secondary | ICD-10-CM | POA: Diagnosis not present

## 2023-01-19 DIAGNOSIS — G4733 Obstructive sleep apnea (adult) (pediatric): Secondary | ICD-10-CM | POA: Diagnosis not present

## 2023-02-18 DIAGNOSIS — G4733 Obstructive sleep apnea (adult) (pediatric): Secondary | ICD-10-CM | POA: Diagnosis not present

## 2023-03-21 DIAGNOSIS — G4733 Obstructive sleep apnea (adult) (pediatric): Secondary | ICD-10-CM | POA: Diagnosis not present

## 2023-04-05 ENCOUNTER — Ambulatory Visit: Payer: PPO | Admitting: Cardiology

## 2023-04-20 DIAGNOSIS — G4733 Obstructive sleep apnea (adult) (pediatric): Secondary | ICD-10-CM | POA: Diagnosis not present

## 2023-05-21 DIAGNOSIS — G4733 Obstructive sleep apnea (adult) (pediatric): Secondary | ICD-10-CM | POA: Diagnosis not present

## 2023-06-21 DIAGNOSIS — G4733 Obstructive sleep apnea (adult) (pediatric): Secondary | ICD-10-CM | POA: Diagnosis not present

## 2023-06-22 ENCOUNTER — Other Ambulatory Visit: Payer: Self-pay | Admitting: Family Medicine

## 2023-06-22 DIAGNOSIS — G4733 Obstructive sleep apnea (adult) (pediatric): Secondary | ICD-10-CM | POA: Diagnosis not present

## 2023-06-22 DIAGNOSIS — Z1231 Encounter for screening mammogram for malignant neoplasm of breast: Secondary | ICD-10-CM

## 2023-06-23 ENCOUNTER — Ambulatory Visit
Admission: RE | Admit: 2023-06-23 | Discharge: 2023-06-23 | Disposition: A | Discharge status: Home / Self Care | Payer: PPO | Source: Ambulatory Visit | Attending: Family Medicine | Admitting: Family Medicine

## 2023-06-23 DIAGNOSIS — Z1231 Encounter for screening mammogram for malignant neoplasm of breast: Secondary | ICD-10-CM | POA: Diagnosis not present

## 2023-06-29 DIAGNOSIS — E785 Hyperlipidemia, unspecified: Secondary | ICD-10-CM | POA: Diagnosis not present

## 2023-06-29 DIAGNOSIS — R7303 Prediabetes: Secondary | ICD-10-CM | POA: Diagnosis not present

## 2023-06-29 DIAGNOSIS — Z23 Encounter for immunization: Secondary | ICD-10-CM | POA: Diagnosis not present

## 2023-06-29 DIAGNOSIS — I10 Essential (primary) hypertension: Secondary | ICD-10-CM | POA: Diagnosis not present

## 2023-06-29 DIAGNOSIS — I77810 Thoracic aortic ectasia: Secondary | ICD-10-CM | POA: Diagnosis not present

## 2023-06-29 DIAGNOSIS — F320 Major depressive disorder, single episode, mild: Secondary | ICD-10-CM | POA: Diagnosis not present

## 2023-06-29 DIAGNOSIS — E2839 Other primary ovarian failure: Secondary | ICD-10-CM | POA: Diagnosis not present

## 2023-06-29 DIAGNOSIS — F419 Anxiety disorder, unspecified: Secondary | ICD-10-CM | POA: Diagnosis not present

## 2023-06-29 DIAGNOSIS — Z0000 Encounter for general adult medical examination without abnormal findings: Secondary | ICD-10-CM | POA: Diagnosis not present

## 2023-06-29 DIAGNOSIS — E041 Nontoxic single thyroid nodule: Secondary | ICD-10-CM | POA: Diagnosis not present

## 2023-06-29 DIAGNOSIS — G4733 Obstructive sleep apnea (adult) (pediatric): Secondary | ICD-10-CM | POA: Diagnosis not present

## 2023-06-30 ENCOUNTER — Other Ambulatory Visit: Payer: Self-pay | Admitting: Family Medicine

## 2023-06-30 DIAGNOSIS — E2839 Other primary ovarian failure: Secondary | ICD-10-CM

## 2023-06-30 DIAGNOSIS — E041 Nontoxic single thyroid nodule: Secondary | ICD-10-CM

## 2023-07-02 ENCOUNTER — Ambulatory Visit
Admission: RE | Admit: 2023-07-02 | Discharge: 2023-07-02 | Disposition: A | Discharge status: Home / Self Care | Payer: PPO | Source: Ambulatory Visit | Attending: Family Medicine | Admitting: Family Medicine

## 2023-07-02 DIAGNOSIS — E042 Nontoxic multinodular goiter: Secondary | ICD-10-CM | POA: Diagnosis not present

## 2023-07-02 DIAGNOSIS — E041 Nontoxic single thyroid nodule: Secondary | ICD-10-CM

## 2023-07-20 NOTE — Progress Notes (Unsigned)
Cardiology Office Note:    Date:  07/22/2023   ID:  Brittany Villegas, DOB 03-Jun-1951, MRN [id number]  PCP:  Farris Has, MD  Cardiologist:  Little Ishikawa, MD  Electrophysiologist:  None   Referring MD: Farris Has, MD   Chief Complaint  Patient presents with   Palpitations    History of Present Illness:    Brittany Villegas is a 72 y.o. female with a hx of prediabetes, hyperlipidemia, morbid obesity depression who presents for follow-up.  She was referred by Dr. Kateri Plummer for evaluation of rapid heart rate, initially seen on 12/29/2019.  Was seen by Dr. Kateri Plummer on 3/18 and started on metoprolol.  She reports episodes of tachycardia for several years. Reports has been occurring since 2012. Has had 2 episodes this year. During episodes feels like heart is racing. Lasts for hours. Check pulse during last episode in was 173. Does not feel irregular. Denies any lightheadedness, syncope, or chest pain. She does report she has been having some dyspnea with exertion. Since starting metoprolol she feels very fatigued and has not been exercising. Her HR has been down to 40s.  No smoking history. Mother had mitral valve replacement at age 81, atrial fibrillation, pacemaker. Father had atrial fibrillation in 80s, pacemaker. She reports she drinks 1 cup of coffee per day. No alcohol intake.Labs from 3/18 showed normal renal function, electrolytes, TSH, blood counts.  A1c 6.0.  TTE on 01/19/2020 showed LVEF 60 to 65%, normal RV function, mild mitral regurgitation, mild ascending aortic dilatation measuring 39 mm.  CTA chest on 02/12/2020 showed mildly ectatic ascending thoracic aorta measuring 39 mm.  Cardiac monitor on 02/07/2020 showed multiple short episodes of SVT, as well as a 3-hour episode of what was recorded as atrial fibrillation, but appears more likely SVT.  She was unable to tolerate low-dose metoprolol due to baseline bradycardia.  Referred to EP and underwent successful SVT ablation on  04/29/2020.  Since last clinic visit, she reports she is doing well.  Reports occasional palpitations but just lasts few seconds.  She denies any chest pain, dyspnea, lightheadedness, syncope, or palpitations.  Reports occasional lower extremity edema.  States that she has not been exercising.  Past Medical History:  Diagnosis Date   Depression     Past Surgical History:  Procedure Laterality Date   BREAST BIOPSY Left 07/07/2022   DILATION AND CURETTAGE OF UTERUS     HOT HEMOSTASIS  01/13/2012   Procedure: HOT HEMOSTASIS (ARGON PLASMA COAGULATION/BICAP);  Surgeon: Willis Modena, MD;  Location: [facility];  Service: Endoscopy;  Laterality: N/A;   SVT ABLATION N/A 04/29/2020   Procedure: SVT ABLATION;  Surgeon: Marinus Maw, MD;  Location: [facility];  Service: Cardiovascular;  Laterality: N/A;    Current Medications: Current Meds  Medication Sig   amLODipine (NORVASC) 10 MG tablet TAKE 1 TABLET(10 MG) BY MOUTH DAILY   atorvastatin (LIPITOR) 10 MG tablet Take 5 mg by mouth daily.   metroNIDAZOLE (METROCREAM) 0.75 % cream Apply 1 application topically 2 (two) times daily.   Multiple Vitamin (MULTIVITAMIN WITH MINERALS) TABS tablet Take 1 tablet by mouth daily.   sertraline (ZOLOFT) 100 MG tablet Take 100 mg by mouth daily.     Allergies:   Patient has no known allergies.   Social History   Socioeconomic History   Marital status: Single    Spouse name: Not on file   Number of children: Not on file   Years of education: Not on file  Highest education level: Not on file  Occupational History   Not on file  Tobacco Use   Smoking status: Never   Smokeless tobacco: Never  Vaping Use   Vaping status: Never Used  Substance and Sexual Activity   Alcohol use: Yes   Drug use: No   Sexual activity: Not on file  Other Topics Concern   Not on file  Social History Narrative   Not on file   Social Determinants of Health   Financial Resource Strain: Not on file   Food Insecurity: Not on file  Transportation Needs: Not on file  Physical Activity: Not on file  Stress: Not on file  Social Connections: Not on file     Family History: The patient's family history is not on file.  ROS:   Please see the history of present illness.     All other systems reviewed and are negative.  EKGs/Labs/Other Studies Reviewed:    The following studies were reviewed today:   EKG:   07/22/2023: Normal sinus rhythm, rate 63, nonspecific T wave flattening.   Recent Labs: No results found for requested labs within last 365 days.  Recent Lipid Panel No results found for: "CHOL", "TRIG", "HDL", "CHOLHDL", "VLDL", "LDLCALC", "LDLDIRECT"  Physical Exam:    VS:  BP 124/82 (BP Location: Right Arm, Patient Position: Sitting, Cuff Size: Large)   Pulse 63   Ht 5\' 3"  (1.6 m)   Wt 287 lb (130.2 kg)   SpO2 94%   BMI 50.84 kg/m     Wt Readings from Last 3 Encounters:  07/22/23 287 lb (130.2 kg)  09/24/21 250 lb (113.4 kg)  09/23/21 254 lb 9.6 oz (115.5 kg)     GEN:  in no acute distress HEENT: Normal NECK: No JVD; No carotid bruits CARDIAC: RRR, no murmurs, rubs, gallops RESPIRATORY:  Clear to auscultation without rales, wheezing or rhonchi  ABDOMEN: Soft, non-tender, non-distended MUSCULOSKELETAL:  No edema; No deformity  SKIN: Warm and dry NEUROLOGIC:  Alert and oriented x 3 PSYCHIATRIC:  Normal affect   ASSESSMENT:    1. SVT (supraventricular tachycardia) (HCC)   2. Obstructive sleep apnea   3. Essential hypertension   4. Hyperlipidemia, unspecified hyperlipidemia type   5. Prediabetes   6. Morbid obesity (HCC)     PLAN:    SVT: Cardiac monitor on 02/07/2020 showed multiple short episodes of SVT, as well as a 3-hour episode of what was recorded as atrial fibrillation, but appears more likely SVT.  She was unable to tolerate low-dose metoprolol due to baseline bradycardia.  Referred to EP and underwent successful SVT ablation on 04/29/2020. -She  reports rare episodes of palpitations of short duration.  Discussed Kardia mobile device for further monitoring  Hypertension: On amlodipine 10 mg daily.  Appears controlled  OSA: on CPAP, reports compliance  Dilated thoracic aorta: Ascending aorta measures 39 mm on CTA, MRA on 03/03/2021 showed mildly ectatic thoracic aorta measuring 37 mm.  Hyperlipidemia: LDL 101 on 06/29/23, on atorvastatin 5 mg daily.  Calcium score on 10/22/2021 was 0.  Morbid obesity: Body mass index is 50.84 kg/m.  Follows with [facility]    RTC in 1 year   Medication Adjustments/Labs and Tests Ordered: Current medicines are reviewed at length with the patient today.  Concerns regarding medicines are outlined above.  Orders Placed This Encounter  Procedures   EKG 12-Lead   No orders of the defined types were placed in this encounter.   Patient Instructions  Medication Instructions:  No changes *If you need a refill on your cardiac medications before your next appointment, please call your pharmacy*  You can look into the [facility] device by [employer]. This device is purchased by you and it connects to an application you download to your smart phone.  It can detect abnormal heart rhythms and alert you to contact your doctor for further evaluation. The web site is:  https://www.alivecor.com     Follow-Up: At [facility], you and your health needs are our priority.  As part of our continuing mission to provide you with exceptional heart care, we have created designated Provider Care Teams.  These Care Teams include your primary Cardiologist (physician) and Advanced Practice Providers (APPs -  Physician Assistants and Nurse Practitioners) who all work together to provide you with the care you need, when you need it.  We recommend signing up for the patient portal called "MyChart".  Sign up information is provided on this After Visit Summary.  MyChart is used to connect with  patients for Virtual Visits (Telemedicine).  Patients are able to view lab/test results, encounter notes, upcoming appointments, etc.  Non-urgent messages can be sent to your provider as well.   To learn more about what you can do with MyChart, go to [email or web address].    Your next appointment:   1 year(s)  Provider:   Little Ishikawa, MD       Signed, Little Ishikawa, MD  07/22/2023 10:05 AM    [facility]

## 2023-07-22 ENCOUNTER — Ambulatory Visit: Payer: PPO | Attending: Cardiology | Admitting: Cardiology

## 2023-07-22 ENCOUNTER — Encounter: Payer: Self-pay | Admitting: Cardiology

## 2023-07-22 VITALS — BP 124/82 | HR 63 | Ht 63.0 in | Wt 287.0 lb

## 2023-07-22 DIAGNOSIS — G4733 Obstructive sleep apnea (adult) (pediatric): Secondary | ICD-10-CM

## 2023-07-22 DIAGNOSIS — R7303 Prediabetes: Secondary | ICD-10-CM

## 2023-07-22 DIAGNOSIS — I471 Supraventricular tachycardia, unspecified: Secondary | ICD-10-CM

## 2023-07-22 DIAGNOSIS — E785 Hyperlipidemia, unspecified: Secondary | ICD-10-CM

## 2023-07-22 DIAGNOSIS — I10 Essential (primary) hypertension: Secondary | ICD-10-CM

## 2023-07-22 NOTE — Patient Instructions (Signed)
Medication Instructions:  No changes *If you need a refill on your cardiac medications before your next appointment, please call your pharmacy*  You can look into the [facility] device by [employer]. This device is purchased by you and it connects to an application you download to your smart phone.  It can detect abnormal heart rhythms and alert you to contact your doctor for further evaluation. The web site is:  [email or web address]://www.alivecor.com     Follow-Up: At [facility], you and your health needs are our priority.  As part of our continuing mission to provide you with exceptional heart care, we have created designated Provider Care Teams.  These Care Teams include your primary Cardiologist (physician) and Advanced Practice Providers (APPs -  Physician Assistants and Nurse Practitioners) who all work together to provide you with the care you need, when you need it.  We recommend signing up for the patient portal called "MyChart".  Sign up information is provided on this After Visit Summary.  MyChart is used to connect with patients for Virtual Visits (Telemedicine).  Patients are able to view lab/test results, encounter notes, upcoming appointments, etc.  Non-urgent messages can be sent to your provider as well.   To learn more about what you can do with MyChart, go to [email or web address].    Your next appointment:   1 year(s)  Provider:   Little Ishikawa, MD

## 2023-09-08 DIAGNOSIS — G4733 Obstructive sleep apnea (adult) (pediatric): Secondary | ICD-10-CM | POA: Diagnosis not present

## 2023-09-08 DIAGNOSIS — I10 Essential (primary) hypertension: Secondary | ICD-10-CM | POA: Diagnosis not present

## 2023-10-09 ENCOUNTER — Other Ambulatory Visit: Payer: Self-pay | Admitting: Cardiology

## 2024-01-05 DIAGNOSIS — G4733 Obstructive sleep apnea (adult) (pediatric): Secondary | ICD-10-CM | POA: Diagnosis not present

## 2024-01-10 DIAGNOSIS — R059 Cough, unspecified: Secondary | ICD-10-CM | POA: Diagnosis not present

## 2024-02-29 ENCOUNTER — Other Ambulatory Visit: Payer: PPO

## 2024-03-07 DIAGNOSIS — Q141 Congenital malformation of retina: Secondary | ICD-10-CM | POA: Diagnosis not present

## 2024-03-07 DIAGNOSIS — H35373 Puckering of macula, bilateral: Secondary | ICD-10-CM | POA: Diagnosis not present

## 2024-03-07 DIAGNOSIS — H01001 Unspecified blepharitis right upper eyelid: Secondary | ICD-10-CM | POA: Diagnosis not present

## 2024-03-07 DIAGNOSIS — H01004 Unspecified blepharitis left upper eyelid: Secondary | ICD-10-CM | POA: Diagnosis not present

## 2024-03-07 DIAGNOSIS — B880 Other acariasis: Secondary | ICD-10-CM | POA: Diagnosis not present

## 2024-04-03 ENCOUNTER — Ambulatory Visit (HOSPITAL_BASED_OUTPATIENT_CLINIC_OR_DEPARTMENT_OTHER)

## 2024-04-03 ENCOUNTER — Encounter (HOSPITAL_BASED_OUTPATIENT_CLINIC_OR_DEPARTMENT_OTHER): Payer: Self-pay | Admitting: Student

## 2024-04-03 ENCOUNTER — Ambulatory Visit (HOSPITAL_BASED_OUTPATIENT_CLINIC_OR_DEPARTMENT_OTHER): Admitting: Student

## 2024-04-03 DIAGNOSIS — M25562 Pain in left knee: Secondary | ICD-10-CM

## 2024-04-03 DIAGNOSIS — S82002A Unspecified fracture of left patella, initial encounter for closed fracture: Secondary | ICD-10-CM | POA: Diagnosis not present

## 2024-04-03 DIAGNOSIS — M1712 Unilateral primary osteoarthritis, left knee: Secondary | ICD-10-CM

## 2024-04-03 DIAGNOSIS — S82035A Nondisplaced transverse fracture of left patella, initial encounter for closed fracture: Secondary | ICD-10-CM

## 2024-04-03 NOTE — Progress Notes (Signed)
 Chief Complaint: Left knee pain    Discussed the use of AI scribe software for clinical note transcription with the patient, who gave verbal consent to proceed.  History of Present Illness Brittany Villegas is a 73 year old female who presents with knee pain following a fall. She tripped over items on the floor yesterday, landing on her knees. The pain is localized on the lateral side of the left knee and was severe upon waking this morning, though it has improved with movement. She takes ibuprofen for pain relief and avoids weight-bearing activities. She can straighten her knee without significant discomfort but experiences increased pain when standing and bearing weight.  No numbness or tingling is present.  Denies any previous history of knee injury or surgery.  She is active in preparing to put her house on the market within the next month.   Surgical History:   None  PMH/PSH/Family History/Social History/Meds/Allergies:    Past Medical History:  Diagnosis Date   Depression    Past Surgical History:  Procedure Laterality Date   BREAST BIOPSY Left 07/07/2022   DILATION AND CURETTAGE OF UTERUS     HOT HEMOSTASIS  01/13/2012   Procedure: HOT HEMOSTASIS (ARGON PLASMA COAGULATION/BICAP);  Surgeon: Elsie Cree, MD;  Location: [facility];  Service: Endoscopy;  Laterality: N/A;   SVT ABLATION N/A 04/29/2020   Procedure: SVT ABLATION;  Surgeon: Waddell Danelle ORN, MD;  Location: [facility];  Service: Cardiovascular;  Laterality: N/A;   Social History   Socioeconomic History   Marital status: Single    Spouse name: Not on file   Number of children: Not on file   Years of education: Not on file   Highest education level: Not on file  Occupational History   Not on file  Tobacco Use   Smoking status: Never   Smokeless tobacco: Never  Vaping Use   Vaping status: Never Used  Substance and Sexual Activity   Alcohol use: Yes   Drug use: No    Sexual activity: Not on file  Other Topics Concern   Not on file  Social History Narrative   Not on file   Social Drivers of Health   Financial Resource Strain: Not on file  Food Insecurity: Not on file  Transportation Needs: Not on file  Physical Activity: Not on file  Stress: Not on file  Social Connections: Not on file   History reviewed. No pertinent family history. No Known Allergies Current Outpatient Medications  Medication Sig Dispense Refill   amLODipine  (NORVASC ) 10 MG tablet TAKE 1 TABLET(10 MG) BY MOUTH DAILY 90 tablet 2   atorvastatin (LIPITOR) 10 MG tablet Take 5 mg by mouth daily.     metroNIDAZOLE (METROCREAM) 0.75 % cream Apply 1 application topically 2 (two) times daily.     Multiple Vitamin (MULTIVITAMIN WITH MINERALS) TABS tablet Take 1 tablet by mouth daily.     sertraline (ZOLOFT) 100 MG tablet Take 100 mg by mouth daily.     No current facility-administered medications for this visit.   No results found.  Review of Systems:   A ROS was performed including pertinent positives and negatives as documented in the HPI.  Physical Exam :   Constitutional: NAD and appears stated age Neurological: Alert and oriented Psych: Appropriate affect and cooperative There were  no vitals taken for this visit.   Comprehensive Musculoskeletal Exam:    Exam of the left knee demonstrates tenderness along the lateral border of the patella.  No palpable or visible deformity is appreciated.  Active range of motion is from 0 to 90 degrees.  No joint line tenderness present.  Minimal overlying ecchymosis.  Distal neurosensory exam intact.  Imaging:   Xray (left knee 4 views): Nondisplaced transverse patellar fracture.  Mild to moderate tricompartmental degenerative changes with medial joint space narrowing and patellofemoral osteophytes.   I personally reviewed and interpreted the radiographs.      Assessment & Plan Nondisplaced fracture of the patella   X-ray  confirms a nondisplaced left patella fracture sustained in fall yesterday. Prognosis is good due to maintained knee extension and lack of displacement. Conservative management is appropriate. Provide a hinged knee brace today locked in extension and can allow for weightbearing as tolerated. Schedule a follow-up in 4 weeks with x-rays to assess healing. Instruct on proper knee brace use and adjustment.  Mild to moderate knee osteoarthritis   Mild to moderate osteoarthritis with medial space loss is present. The condition is not severe. Monitor osteoarthritis progression and symptoms during follow-ups.     I personally saw and evaluated the patient, and participated in the management and treatment plan.  Leonce Reveal, PA-C Orthopedics

## 2024-05-04 ENCOUNTER — Ambulatory Visit (HOSPITAL_BASED_OUTPATIENT_CLINIC_OR_DEPARTMENT_OTHER): Admitting: Physician Assistant

## 2024-05-04 ENCOUNTER — Other Ambulatory Visit (HOSPITAL_BASED_OUTPATIENT_CLINIC_OR_DEPARTMENT_OTHER): Payer: Self-pay | Admitting: Physician Assistant

## 2024-05-04 ENCOUNTER — Encounter (HOSPITAL_BASED_OUTPATIENT_CLINIC_OR_DEPARTMENT_OTHER): Payer: Self-pay | Admitting: Physician Assistant

## 2024-05-04 ENCOUNTER — Ambulatory Visit (HOSPITAL_BASED_OUTPATIENT_CLINIC_OR_DEPARTMENT_OTHER)

## 2024-05-04 DIAGNOSIS — S82002A Unspecified fracture of left patella, initial encounter for closed fracture: Secondary | ICD-10-CM | POA: Diagnosis not present

## 2024-05-04 DIAGNOSIS — S82035A Nondisplaced transverse fracture of left patella, initial encounter for closed fracture: Secondary | ICD-10-CM

## 2024-05-04 DIAGNOSIS — M1712 Unilateral primary osteoarthritis, left knee: Secondary | ICD-10-CM | POA: Diagnosis not present

## 2024-05-04 NOTE — Progress Notes (Signed)
 Office Visit Note   Patient: Brittany Villegas           Date of Birth: 01-Sep-1951           MRN: [id number] Visit Date: 05/04/2024              Requested by: Kip Righter, MD [address] [place],  [place] [address] PCP: Kip Righter, MD  Chief Complaint  Patient presents with   Left Knee - Follow-up      HPI: Pleasant 73 year old woman is a little over a month status post nondisplaced lateral patella fracture.  She is doing extremely well she has great motion she really says she does not have much pain at all  Assessment & Plan: Visit Diagnoses:  1. Closed nondisplaced transverse fracture of left patella, initial encounter     Plan: 1 month status post nondisplaced patella fracture.  She is doing well and ambulating well has no weakness has fairly good range of motion she is gena get back to doing all she enjoys and use a stationary bike.  If for some reason she has pain or wishes to do PT she will contact me  Follow-Up Instructions: No follow-ups on file.   Ortho Exam  Patient is alert, oriented, no adenopathy, well-dressed, normal affect, normal respiratory effort. Examination of her knees she has no effusion no erythema no tenderness to palpation she has good extension and flexion of her leg and strength    Imaging: No results found. No images are attached to the encounter.  Labs: No results found for: HGBA1C, ESRSEDRATE, CRP, LABURIC, REPTSTATUS, GRAMSTAIN, CULT, LABORGA   No results found for: ALBUMIN, PREALBUMIN, CBC  No results found for: MG No results found for: VD25OH  No results found for: PREALBUMIN    Latest Ref Rng & Units 04/24/2020    1:10 PM  CBC EXTENDED  WBC 3.4 - 10.8 x10E3/uL 8.2   RBC 3.77 - 5.28 x10E6/uL 4.81   Hemoglobin 11.1 - 15.9 g/dL 85.7   HCT 65.9 - 53.3 % 42.6   Platelets 150 - 450 x10E3/uL 281   NEUT# 1.4 - 7.0 x10E3/uL 6.3   Lymph# 0.7 - 3.1 x10E3/uL 1.2      There is no  height or weight on file to calculate BMI.  Orders:  No orders of the defined types were placed in this encounter.  No orders of the defined types were placed in this encounter.    Procedures: No procedures performed  Clinical Data: No additional findings.  ROS:  All other systems negative, except as noted in the HPI. Review of Systems  Objective: Vital Signs: There were no vitals taken for this visit.  Specialty Comments:  No specialty comments available.  PMFS History: Patient Active Problem List   Diagnosis Date Noted   SVT (supraventricular tachycardia) (HCC) 05/30/2020   Past Medical History:  Diagnosis Date   Depression     History reviewed. No pertinent family history.  Past Surgical History:  Procedure Laterality Date   BREAST BIOPSY Left 07/07/2022   DILATION AND CURETTAGE OF UTERUS     HOT HEMOSTASIS  01/13/2012   Procedure: HOT HEMOSTASIS (ARGON PLASMA COAGULATION/BICAP);  Surgeon: Elsie Cree, MD;  Location: [facility];  Service: Endoscopy;  Laterality: N/A;   SVT ABLATION N/A 04/29/2020   Procedure: SVT ABLATION;  Surgeon: Waddell Danelle ORN, MD;  Location: [facility];  Service: Cardiovascular;  Laterality: N/A;   Social History   Occupational History  Not on file  Tobacco Use   Smoking status: Never   Smokeless tobacco: Never  Vaping Use   Vaping status: Never Used  Substance and Sexual Activity   Alcohol use: Yes   Drug use: No   Sexual activity: Not on file

## 2024-07-05 DIAGNOSIS — Z0000 Encounter for general adult medical examination without abnormal findings: Secondary | ICD-10-CM | POA: Diagnosis not present

## 2024-07-05 DIAGNOSIS — E041 Nontoxic single thyroid nodule: Secondary | ICD-10-CM | POA: Diagnosis not present

## 2024-07-05 DIAGNOSIS — F320 Major depressive disorder, single episode, mild: Secondary | ICD-10-CM | POA: Diagnosis not present

## 2024-07-05 DIAGNOSIS — G4733 Obstructive sleep apnea (adult) (pediatric): Secondary | ICD-10-CM | POA: Diagnosis not present

## 2024-07-05 DIAGNOSIS — E785 Hyperlipidemia, unspecified: Secondary | ICD-10-CM | POA: Diagnosis not present

## 2024-07-05 DIAGNOSIS — I10 Essential (primary) hypertension: Secondary | ICD-10-CM | POA: Diagnosis not present

## 2024-07-05 DIAGNOSIS — Z23 Encounter for immunization: Secondary | ICD-10-CM | POA: Diagnosis not present

## 2024-07-05 DIAGNOSIS — I77810 Thoracic aortic ectasia: Secondary | ICD-10-CM | POA: Diagnosis not present

## 2024-07-05 DIAGNOSIS — F419 Anxiety disorder, unspecified: Secondary | ICD-10-CM | POA: Diagnosis not present

## 2024-07-05 DIAGNOSIS — R7303 Prediabetes: Secondary | ICD-10-CM | POA: Diagnosis not present

## 2024-07-11 ENCOUNTER — Other Ambulatory Visit: Payer: Self-pay | Admitting: Cardiology

## 2024-07-24 ENCOUNTER — Encounter: Payer: Self-pay | Admitting: Radiology
